# Patient Record
Sex: Female | Born: 1985 | Hispanic: Refuse to answer | Marital: Married | Smoking: Never smoker
Health system: Southern US, Community
[De-identification: ages and names within clinical notes are randomized; demographics above are authoritative.]

## PROBLEM LIST (undated history)

## (undated) DIAGNOSIS — B029 Zoster without complications: Secondary | ICD-10-CM

## (undated) HISTORY — DX: Zoster without complications: B02.9

---
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
## 2007-02-25 HISTORY — PX: WISDOM TOOTH EXTRACTION: SHX21

## 2018-01-25 ENCOUNTER — Encounter: Payer: Self-pay | Admitting: Certified Nurse Midwife

## 2018-01-26 ENCOUNTER — Ambulatory Visit (INDEPENDENT_AMBULATORY_CARE_PROVIDER_SITE_OTHER): Payer: BLUE CROSS/BLUE SHIELD | Admitting: Certified Nurse Midwife

## 2018-01-26 ENCOUNTER — Encounter: Payer: Self-pay | Admitting: Certified Nurse Midwife

## 2018-01-26 VITALS — BP 112/74 | HR 92 | Ht 68.0 in | Wt 204.9 lb

## 2018-01-26 DIAGNOSIS — N912 Amenorrhea, unspecified: Secondary | ICD-10-CM | POA: Diagnosis not present

## 2018-01-26 LAB — POCT URINE PREGNANCY: Preg Test, Ur: POSITIVE — AB

## 2018-01-26 NOTE — Progress Notes (Signed)
Patient here for NOB confirmation.  Patient c/o getting car sick on and off and chest is tender to touch.

## 2018-01-26 NOTE — Progress Notes (Signed)
GYN ENCOUNTER NOTE  Subjective:       Katrina Tate is a 32 y.o. G1P0 female here for pregnancy confirmation. Accompanied by spouse.   Reports two (2) positive home pregnancy test. Endorses breast tenderness and motion sickness.   Denies difficulty breathing or respiratory distress, chest pain, abdominal pain, vaginal bleeding, dysuria, and leg pain or swelling.    Gynecologic History  Patient's last menstrual period was 12/07/2017 (exact date).  Contraception: none  Obstetric History  OB History  Gravida Para Term Preterm AB Living  1            SAB TAB Ectopic Multiple Live Births               # Outcome Date GA Lbr Len/2nd Weight Sex Delivery Anes PTL Lv  1 Current             Past Medical History:  Diagnosis Date  . Shingles 1993    Current Outpatient Medications on File Prior to Visit  Medication Sig Dispense Refill  . Prenatal Vit-Fe Fumarate-FA (MULTIVITAMIN-PRENATAL) 27-0.8 MG TABS tablet Take 1 tablet by mouth daily at 12 noon.     No current facility-administered medications on file prior to visit.     No Known Allergies  Social History   Socioeconomic History  . Marital status: Married    Spouse name: Not on file  . Number of children: Not on file  . Years of education: Not on file  . Highest education level: Not on file  Occupational History  . Not on file  Social Needs  . Financial resource strain: Not on file  . Food insecurity:    Worry: Not on file    Inability: Not on file  . Transportation needs:    Medical: Not on file    Non-medical: Not on file  Tobacco Use  . Smoking status: Never Smoker  . Smokeless tobacco: Never Used  Substance and Sexual Activity  . Alcohol use: Not Currently  . Drug use: Never  . Sexual activity: Yes    Birth control/protection: None  Lifestyle  . Physical activity:    Days per week: Not on file    Minutes per session: Not on file  . Stress: Not on file  Relationships  . Social connections:     Talks on phone: Not on file    Gets together: Not on file    Attends religious service: Not on file    Active member of club or organization: Not on file    Attends meetings of clubs or organizations: Not on file    Relationship status: Not on file  . Intimate partner violence:    Fear of current or ex partner: Not on file    Emotionally abused: Not on file    Physically abused: Not on file    Forced sexual activity: Not on file  Other Topics Concern  . Not on file  Social History Narrative  . Not on file    Family History  Problem Relation Age of Onset  . Breast cancer Neg Hx   . Ovarian cancer Neg Hx   . Colon cancer Neg Hx     The following portions of the patient's history were reviewed and updated as appropriate: allergies, current medications, past family history, past medical history, past social history, past surgical history and problem list.  Review of Systems  ROS negative except as noted above. Information obtained from patient and spouse.   Objective:  BP 112/74   Pulse 92   Ht 5\' 8"  (1.727 m)   Wt 204 lb 14.4 oz (92.9 kg)   LMP 12/07/2017 (Exact Date)   BMI 31.15 kg/m    GENERAL: Alert and oriented x 4, no apparent distress.   PHYSICAL EXAM: Not indicated.   Recent Results (from the past 2160 hour(s))  POCT urine pregnancy     Status: Abnormal   Collection Time: 01/26/18  3:49 PM  Result Value Ref Range   Preg Test, Ur Positive (A) Negative    Assessment:   1. Amenorrhea  - POCT urine pregnancy  Plan:   First trimester education, see AVS  Practice policies and midwifery vs MD care discussed, handouts given  Reviewed red flag symptoms and when to call  RTC x 1-2 weeks for dating/viability US and Nurse Intake or sooner if needed   Gunnar BullaJenkins Michelle , CNM [facility] 01/26/18 5:03 PM    .A total of 20 minutes were spent face-to-face with the patient during the encounter with greater than 50% dealing with  counseling and coordination of care.

## 2018-01-26 NOTE — Patient Instructions (Signed)
Round Ligament Pain The round ligament is a cord of muscle and tissue that helps to support the uterus. It can become a source of pain during pregnancy if it becomes stretched or twisted as the baby grows. The pain usually begins in the second trimester of pregnancy, and it can come and go until the baby is delivered. It is not a serious problem, and it does not cause harm to the baby. Round ligament pain is usually a short, sharp, and pinching pain, but it can also be a dull, lingering, and aching pain. The pain is felt in the lower side of the abdomen or in the groin. It usually starts deep in the groin and moves up to the outside of the hip area. Pain can occur with:  A sudden change in position.  Rolling over in bed.  Coughing or sneezing.  Physical activity.  Follow these instructions at home: Watch your condition for any changes. Take these steps to help with your pain:  When the pain starts, relax. Then try: ? Sitting down. ? Flexing your knees up to your abdomen. ? Lying on your side with one pillow under your abdomen and another pillow between your legs. ? Sitting in a warm bath for 15-20 minutes or until the pain goes away.  Take over-the-counter and prescription medicines only as told by your health care provider.  Move slowly when you sit and stand.  Avoid long walks if they cause pain.  Stop or lessen your physical activities if they cause pain.  Contact a health care provider if:  Your pain does not go away with treatment.  You feel pain in your back that you did not have before.  Your medicine is not helping. Get help right away if:  You develop a fever or chills.  You develop uterine contractions.  You develop vaginal bleeding.  You develop nausea or vomiting.  You develop diarrhea.  You have pain when you urinate. This information is not intended to replace advice given to you by your health care provider. Make sure you discuss any questions you have  with your health care provider. Document Released: 11/20/2007 Document Revised: 07/19/2015 Document Reviewed: 04/19/2014 Elsevier Interactive Patient Education  2018 [employer].  Heartburn During Pregnancy Heartburn is pain or discomfort in the throat or chest. It may cause a burning feeling. It happens when stomach acid moves up into the tube that carries food from your mouth to your stomach (esophagus). Heartburn is common during pregnancy. It usually goes away or gets better after giving birth. Follow these instructions at home: Eating and drinking  Do not drink alcohol while you are pregnant.  Figure out which foods and beverages make you feel worse, and avoid them.  Beverages that you may want to avoid include: ? Coffee and tea (with or without caffeine). ? Energy drinks and sports drinks. ? Bubbly (carbonated) drinks or sodas. ? Citrus fruit juices.  Foods that you may want to avoid include: ? Chocolate and cocoa. ? Peppermint and mint flavorings. ? Garlic, onions, and horseradish. ? Spicy and acidic foods. These include peppers, chili powder, curry powder, vinegar, hot sauces, and barbecue sauce. ? Citrus fruits, such as oranges, lemons, and limes. ? Tomato-based foods, such as red sauce, chili, and salsa. ? Fried and fatty foods, such as donuts, french fries, potato chips, and high-fat dressings. ? High-fat meats, such as hot dogs, cold cuts, sausage, ham, and bacon. ? High-fat dairy items, such as whole milk, butter, and  cheese.  Eat small meals often, instead of large meals.  Avoid drinking a lot of liquid with your meals.  Avoid eating meals during the 2-3 hours before you go to bed.  Avoid lying down right after you eat.  Do not exercise right after you eat. Medicines  Take over-the-counter and prescription medicines only as told by your doctor.  Do not take aspirin, ibuprofen, or other NSAIDs unless your doctor tells you to do that.  Your doctor may tell  you to avoid medicines that have sodium bicarbonate in them. General instructions  If told, raise the head of your bed about 6 inches (15 cm). You can do this by putting blocks under the legs. Sleeping with more pillows does not help with heartburn.  Do not use any products that contain nicotine or tobacco, such as cigarettes and e-cigarettes. If you need help quitting, ask your doctor.  Wear loose-fitting clothing.  Try to lower your stress, such as with yoga or meditation. If you need help, ask your doctor.  Stay at a healthy weight. If you are overweight, work with your doctor to safely lose weight.  Keep all follow-up visits as told by your doctor. This is important. Contact a doctor if:  You get new symptoms.  Your symptoms do not get better with treatment.  You have weight loss and you do not know why.  You have trouble swallowing.  You make loud sounds when you breathe (wheeze).  You have a cough that does not go away.  You have heartburn often for more than 2 weeks.  You feel sick to your stomach (nauseous), and this does not get better with treatment.  You are throwing up (vomiting), and this does not get better with treatment.  You have pain in your belly (abdomen). Get help right away if:  You have very bad chest pain that spreads to your arm, neck, or jaw.  You feel sweaty, dizzy, or light-headed.  You have trouble breathing.  You have pain when swallowing.  You throw up and your throw-up looks like blood or coffee grounds.  Your poop (stool) is bloody or black. This information is not intended to replace advice given to you by your health care provider. Make sure you discuss any questions you have with your health care provider. Document Released: 03/15/2010 Document Revised: 10/29/2015 Document Reviewed: 10/29/2015 Elsevier Interactive Patient Education  2017 [place].  Back Pain in Pregnancy Back pain during pregnancy is common. Back pain may  be caused by several factors that are related to changes during your pregnancy. Follow these instructions at home: Managing pain, stiffness, and swelling  If directed, apply ice for sudden (acute) back pain. ? Put ice in a plastic bag. ? Place a towel between your skin and the bag. ? Leave the ice on for 20 minutes, 2-3 times per day.  If directed, apply heat to the affected area before you exercise: ? Place a towel between your skin and the heat pack or heating pad. ? Leave the heat on for 20-30 minutes. ? Remove the heat if your skin turns bright red. This is especially important if you are unable to feel pain, heat, or cold. You may have a greater risk of getting burned. Activity  Exercise as told by your health care provider. Exercising is the best way to prevent or manage back pain.  Listen to your body when lifting. If lifting hurts, ask for help or bend your knees. This uses your leg  muscles instead of your back muscles.  Squat down when picking up something from the floor. Do not bend over.  Only use bed rest as told by your health care provider. Bed rest should only be used for the most severe episodes of back pain. Standing, Sitting, and Lying Down  Do not stand in one place for long periods of time.  Use good posture when sitting. Make sure your head rests over your shoulders and is not hanging forward. Use a pillow on your lower back if necessary.  Try sleeping on your side, preferably the left side, with a pillow or two between your legs. If you are sore after a night's rest, your bed may be too soft. A firm mattress may provide more support for your back during pregnancy. General instructions  Do not wear high heels.  Eat a healthy diet. Try to gain weight within your health care provider's recommendations.  Use a maternity girdle, elastic sling, or back brace as told by your health care provider.  Take over-the-counter and prescription medicines only as told by  your health care provider.  Keep all follow-up visits as told by your health care provider. This is important. This includes any visits with any specialists, such as a physical therapist. Contact a health care provider if:  Your back pain interferes with your daily activities.  You have increasing pain in other parts of your body. Get help right away if:  You develop numbness, tingling, weakness, or problems with the use of your arms or legs.  You develop severe back pain that is not controlled with medicine.  You have a sudden change in bowel or bladder control.  You develop shortness of breath, dizziness, or you faint.  You develop nausea, vomiting, or sweating.  You have back pain that is a rhythmic, cramping pain similar to labor pains. Labor pain is usually 1-2 minutes apart, lasts for about 1 minute, and involves a bearing down feeling or pressure in your pelvis.  You have back pain and your water breaks or you have vaginal bleeding.  You have back pain or numbness that travels down your leg.  Your back pain developed after you fell.  You develop pain on one side of your back.  You see blood in your urine.  You develop skin blisters in the area of your back pain. This information is not intended to replace advice given to you by your health care provider. Make sure you discuss any questions you have with your health care provider. Document Released: 05/21/2005 Document Revised: 07/19/2015 Document Reviewed: 10/25/2014 Elsevier Interactive Patient Education  2018 [employer].  Morning Sickness Morning sickness is when you feel sick to your stomach (nauseous) during pregnancy. You may feel sick to your stomach and throw up (vomit). You may feel sick in the morning, but you can feel this way any time of day. Some women feel very sick to their stomach and cannot stop throwing up (hyperemesis gravidarum). Follow these instructions at home:  Only take medicines as told by  your doctor.  Take multivitamins as told by your doctor. Taking multivitamins before getting pregnant can stop or lessen the harshness of morning sickness.  Eat dry toast or unsalted crackers before getting out of bed.  Eat 5 to 6 small meals a day.  Eat dry and bland foods like rice and baked potatoes.  Do not drink liquids with meals. Drink between meals.  Do not eat greasy, fatty, or spicy foods.  Have  someone cook for you if the smell of food causes you to feel sick or throw up.  If you feel sick to your stomach after taking prenatal vitamins, take them at night or with a snack.  Eat protein when you need a snack (nuts, yogurt, cheese).  Eat unsweetened gelatins for dessert.  Wear a bracelet used for sea sickness (acupressure wristband).  Go to a doctor that puts thin needles into certain body points (acupuncture) to improve how you feel.  Do not smoke.  Use a humidifier to keep the air in your house free of odors.  Get lots of fresh air. Contact a doctor if:  You need medicine to feel better.  You feel dizzy or lightheaded.  You are losing weight. Get help right away if:  You feel very sick to your stomach and cannot stop throwing up.  You pass out (faint). This information is not intended to replace advice given to you by your health care provider. Make sure you discuss any questions you have with your health care provider. Document Released: 03/20/2004 Document Revised: 07/19/2015 Document Reviewed: 07/28/2012 Elsevier Interactive Patient Education  2017 [employer] WHAT OB PATIENTS CAN EXPECT   Confirmation of pregnancy and ultrasound ordered if medically indicated-[redacted] weeks gestation  New OB (NOB) intake with nurse and New OB (NOB) labs- [redacted] weeks gestation  New OB (NOB) physical examination with provider- 11/[redacted] weeks gestation  Flu vaccine-[redacted] weeks gestation  Anatomy scan-[redacted] weeks gestation  Glucose tolerance test, blood work to test for anemia,  T-dap vaccine-[redacted] weeks gestation  Vaginal swabs/cultures-STD/Group B strep-[redacted] weeks gestation  Appointments every 4 weeks until 28 weeks  Every 2 weeks from 28 weeks until 36 weeks  Weekly visits from 36 weeks until delivery   Eating Plan for Pregnant Women While you are pregnant, your body will require additional nutrition to help support your growing baby. It is recommended that you consume:  150 additional calories each day during your first trimester.  300 additional calories each day during your second trimester.  300 additional calories each day during your third trimester.  Eating a healthy, well-balanced diet is very important for your health and for your baby's health. You also have a higher need for some vitamins and minerals, such as folic acid, calcium, iron, and vitamin D. What do I need to know about eating during pregnancy?  Do not try to lose weight or go on a diet during pregnancy.  Choose healthy, nutritious foods. Choose  of a sandwich with a glass of milk instead of a candy bar or a high-calorie sugar-sweetened beverage.  Limit your overall intake of foods that have "empty calories." These are foods that have little nutritional value, such as sweets, desserts, candies, sugar-sweetened beverages, and fried foods.  Eat a variety of foods, especially fruits and vegetables.  Take a prenatal vitamin to help meet the additional needs during pregnancy, specifically for folic acid, iron, calcium, and vitamin D.  Remember to stay active. Ask your health care provider for exercise recommendations that are specific to you.  Practice good food safety and cleanliness, such as washing your hands before you eat and after you prepare raw meat. This helps to prevent foodborne illnesses, such as listeriosis, that can be very dangerous for your baby. Ask your health care provider for more information about listeriosis. What does 150 extra calories look like? Healthy options for  an additional 150 calories each day could be any of the following:  Plain low-fat yogurt (6-8  oz) with  cup of berries.  1 apple with 2 teaspoons of peanut butter.  Cut-up vegetables with  cup of hummus.  Low-fat chocolate milk (8 oz or 1 cup).  1 string cheese with 1 medium orange.   of a peanut butter and jelly sandwich on whole-wheat bread (1 tsp of peanut butter).  For 300 calories, you could eat two of those healthy options each day. What is a healthy amount of weight to gain? The recommended amount of weight for you to gain is based on your pre-pregnancy BMI. If your pre-pregnancy BMI was:  Less than 18 (underweight), you should gain 28-40 lb.  18-24.9 (normal), you should gain 25-35 lb.  25-29.9 (overweight), you should gain 15-25 lb.  Greater than 30 (obese), you should gain 11-20 lb.  What if I am having twins or multiples? Generally, pregnant women who will be having twins or multiples may need to increase their daily calories by 300-600 calories each day. The recommended range for total weight gain is 25-54 lb, depending on your pre-pregnancy BMI. Talk with your health care provider for specific guidance about additional nutritional needs, weight gain, and exercise during your pregnancy. What foods can I eat? Grains Any grains. Try to choose whole grains, such as whole-wheat bread, oatmeal, or brown rice. Vegetables Any vegetables. Try to eat a variety of colors and types of vegetables to get a full range of vitamins and minerals. Remember to wash your vegetables well before eating. Fruits Any fruits. Try to eat a variety of colors and types of fruit to get a full range of vitamins and minerals. Remember to wash your fruits well before eating. Meats and Other Protein Sources Lean meats, including chicken, [place], fish, and lean cuts of beef, veal, or pork. Make sure that all meats are cooked to "well done." Tofu. Tempeh. Beans. Eggs. Peanut butter and other nut  butters. Seafood, such as shrimp, crab, and lobster. If you choose fish, select types that are higher in omega-3 fatty acids, including salmon, herring, mussels, trout, sardines, and pollock. Make sure that all meats are cooked to food-safe temperatures. Dairy Pasteurized milk and milk alternatives. Pasteurized yogurt and pasteurized cheese. Cottage cheese. Sour cream. Beverages Water. Juices that contain 100% fruit juice or vegetable juice. Caffeine-free teas and decaffeinated coffee. Drinks that contain caffeine are okay to drink, but it is better to avoid caffeine. Keep your total caffeine intake to less than 200 mg each day (12 oz of coffee, tea, or soda) or as directed by your health care provider. Condiments Any pasteurized condiments. Sweets and Desserts Any sweets and desserts. Fats and Oils Any fats and oils. The items listed above may not be a complete list of recommended foods or beverages. Contact your dietitian for more options. What foods are not recommended? Vegetables Unpasteurized (raw) vegetable juices. Fruits Unpasteurized (raw) fruit juices. Meats and Other Protein Sources Cured meats that have nitrates, such as bacon, salami, and hotdogs. Luncheon meats, bologna, or other deli meats (unless they are reheated until they are steaming hot). Refrigerated pate, meat spreads from a meat counter, smoked seafood that is found in the refrigerated section of a store. Raw fish, such as sushi or sashimi. High mercury content fish, such as tilefish, shark, swordfish, and king mackerel. Raw meats, such as tuna or beef tartare. Undercooked meats and poultry. Make sure that all meats are cooked to food-safe temperatures. Dairy Unpasteurized (raw) milk and any foods that have raw milk in them. Soft cheeses, such as  feta, queso blanco, queso fresco, Brie, Camembert cheeses, blue-veined cheeses, and Panela cheese (unless it is made with pasteurized milk, which must be stated on the  label). Beverages Alcohol. Sugar-sweetened beverages, such as sodas, teas, or energy drinks. Condiments Homemade fermented foods and drinks, such as pickles, sauerkraut, or kombucha drinks. (Store-bought pasteurized versions of these are okay.) Other Salads that are made in the store, such as ham salad, chicken salad, egg salad, tuna salad, and seafood salad. The items listed above may not be a complete list of foods and beverages to avoid. Contact your dietitian for more information. This information is not intended to replace advice given to you by your health care provider. Make sure you discuss any questions you have with your health care provider. Document Released: 11/25/2013 Document Revised: 07/19/2015 Document Reviewed: 07/26/2013 Elsevier Interactive Patient Education  2018 [employer].  Common Medications Safe in Pregnancy  Acne:      Constipation:  Benzoyl Peroxide     Colace  Clindamycin      Dulcolax Suppository  Topica Erythromycin     Fibercon  Salicylic Acid      Metamucil         Miralax AVOID:        Senakot   Accutane    Cough:  Retin-A       Cough Drops  Tetracycline      Phenergan w/ Codeine if Rx  Minocycline      Robitussin (Plain & DM)  Antibiotics:     Crabs/Lice:  Ceclor       RID  Cephalosporins    AVOID:  E-Mycins      Kwell  Keflex  Macrobid/Macrodantin   Diarrhea:  Penicillin      Kao-Pectate  Zithromax      Imodium AD         PUSH FLUIDS AVOID:       Cipro     Fever:  Tetracycline      Tylenol (Regular or Extra  Minocycline       Strength)  Levaquin      Extra Strength-Do not          Exceed 8 tabs/24 hrs Caffeine:        <221m/day (equiv. To 1 cup of coffee or  approx. 3 12 oz sodas)         Gas: Cold/Hayfever:       Gas-X  Benadryl      Mylicon  Claritin       Phazyme  **Claritin-D        Chlor-Trimeton    Headaches:  Dimetapp      ASA-Free Excedrin  Drixoral-Non-Drowsy     Cold Compress  Mucinex (Guaifenasin)     Tylenol  (Regular or Extra  Sudafed/Sudafed-12 Hour     Strength)  **Sudafed PE Pseudoephedrine   Tylenol Cold & Sinus     Vicks Vapor Rub  Zyrtec  **AVOID if Problems With Blood Pressure         Heartburn: Avoid lying down for at least 1 hour after meals  Aciphex      Maalox     Rash:  Milk of Magnesia     Benadryl    Mylanta       1% Hydrocortisone Cream  Pepcid  Pepcid Complete   Sleep Aids:  Prevacid      Ambien   Prilosec       Benadryl  Rolaids       Chamomile Tea  Tums (  Limit 4/day)     Unisom  Zantac       Tylenol PM         Warm milk-add vanilla or  Hemorrhoids:       Sugar for taste  Anusol/Anusol H.C.  (RX: Analapram 2.5%)  Sugar Substitutes:  Hydrocortisone OTC     Ok in moderation  Preparation H      Tucks        Vaseline lotion applied to tissue with wiping    Herpes:     Throat:  Acyclovir      Oragel  Famvir  Valtrex     Vaccines:         Flu Shot Leg Cramps:       *Gardasil  Benadryl      Hepatitis A         Hepatitis B Nasal Spray:       Pneumovax  Saline Nasal Spray     Polio Booster         Tetanus Nausea:       Tuberculosis test or PPD  Vitamin B6 25 mg TID   AVOID:    Dramamine      *Gardasil  Emetrol       Live Poliovirus  Ginger Root 250 mg QID    MMR (measles, mumps &  High Complex Carbs @ Bedtime    rebella)  Sea Bands-Accupressure    Varicella (Chickenpox)  Unisom 1/2 tab TID     *No known complications           If received before Pain:         Known pregnancy;   Darvocet       Resume series after  Lortab        Delivery  Percocet    Yeast:   Tramadol      Femstat  Tylenol 3      Gyne-lotrimin  Ultram       Monistat  Vicodin           MISC:         All Sunscreens           Hair Coloring/highlights          Insect Repellant's          (Including DEET)         Mystic Tans First Trimester of Pregnancy The first trimester of pregnancy is from week 1 until the end of week 13 (months 1 through 3). During this time, your baby will  begin to develop inside you. At 6-8 weeks, the eyes and face are formed, and the heartbeat can be seen on ultrasound. At the end of 12 weeks, all the baby's organs are formed. Prenatal care is all the medical care you receive before the birth of your baby. Make sure you get good prenatal care and follow all of your doctor's instructions. Follow these instructions at home: Medicines  Take over-the-counter and prescription medicines only as told by your doctor. Some medicines are safe and some medicines are not safe during pregnancy.  Take a prenatal vitamin that contains at least 600 micrograms (mcg) of folic acid.  If you have trouble pooping (constipation), take medicine that will make your stool soft (stool softener) if your doctor approves. Eating and drinking  Eat regular, healthy meals.  Your doctor will tell you the amount of weight gain that is right for you.  Avoid raw meat and uncooked cheese.  If you feel sick to  your stomach (nauseous) or throw up (vomit): ? Eat 4 or 5 small meals a day instead of 3 large meals. ? Try eating a few soda crackers. ? Drink liquids between meals instead of during meals.  To prevent constipation: ? Eat foods that are high in fiber, like fresh fruits and vegetables, whole grains, and beans. ? Drink enough fluids to keep your pee (urine) clear or pale yellow. Activity  Exercise only as told by your doctor. Stop exercising if you have cramps or pain in your lower belly (abdomen) or low back.  Do not exercise if it is too hot, too humid, or if you are in a place of great height (high altitude).  Try to avoid standing for long periods of time. Move your legs often if you must stand in one place for a long time.  Avoid heavy lifting.  Wear low-heeled shoes. Sit and stand up straight.  You can have sex unless your doctor tells you not to. Relieving pain and discomfort  Wear a good support bra if your breasts are sore.  Take warm water baths  (sitz baths) to soothe pain or discomfort caused by hemorrhoids. Use hemorrhoid cream if your doctor says it is okay.  Rest with your legs raised if you have leg cramps or low back pain.  If you have puffy, bulging veins (varicose veins) in your legs: ? Wear support hose or compression stockings as told by your doctor. ? Raise (elevate) your feet for 15 minutes, 3-4 times a day. ? Limit salt in your food. Prenatal care  Schedule your prenatal visits by the twelfth week of pregnancy.  Write down your questions. Take them to your prenatal visits.  Keep all your prenatal visits as told by your doctor. This is important. Safety  Wear your seat belt at all times when driving.  Make a list of emergency phone numbers. The list should include numbers for family, friends, the hospital, and police and fire departments. General instructions  Ask your doctor for a referral to a local prenatal class. Begin classes no later than at the start of month 6 of your pregnancy.  Ask for help if you need counseling or if you need help with nutrition. Your doctor can give you advice or tell you where to go for help.  Do not use hot tubs, steam rooms, or saunas.  Do not douche or use tampons or scented sanitary pads.  Do not cross your legs for long periods of time.  Avoid all herbs and alcohol. Avoid drugs that are not approved by your doctor.  Do not use any tobacco products, including cigarettes, chewing tobacco, and electronic cigarettes. If you need help quitting, ask your doctor. You may get counseling or other support to help you quit.  Avoid cat litter boxes and soil used by cats. These carry germs that can cause birth defects in the baby and can cause a loss of your baby (miscarriage) or stillbirth.  Visit your dentist. At home, brush your teeth with a soft toothbrush. Be gentle when you floss. Contact a doctor if:  You are dizzy.  You have mild cramps or pressure in your lower  belly.  You have a nagging pain in your belly area.  You continue to feel sick to your stomach, you throw up, or you have watery poop (diarrhea).  You have a bad smelling fluid coming from your vagina.  You have pain when you pee (urinate).  You have increased puffiness (swelling) in  your face, hands, legs, or ankles. Get help right away if:  You have a fever.  You are leaking fluid from your vagina.  You have spotting or bleeding from your vagina.  You have very bad belly cramping or pain.  You gain or lose weight rapidly.  You throw up blood. It may look like coffee grounds.  You are around people who have [place] measles, fifth disease, or chickenpox.  You have a very bad headache.  You have shortness of breath.  You have any kind of trauma, such as from a fall or a car accident. Summary  The first trimester of pregnancy is from week 1 until the end of week 13 (months 1 through 3).  To take care of yourself and your unborn baby, you will need to eat healthy meals, take medicines only if your doctor tells you to do so, and do activities that are safe for you and your baby.  Keep all follow-up visits as told by your doctor. This is important as your doctor will have to ensure that your baby is healthy and growing well. This information is not intended to replace advice given to you by your health care provider. Make sure you discuss any questions you have with your health care provider. Document Released: 07/30/2007 Document Revised: 02/19/2016 Document Reviewed: 02/19/2016 Elsevier Interactive Patient Education  2017 [employer].

## 2018-02-02 ENCOUNTER — Other Ambulatory Visit: Payer: Self-pay | Admitting: Obstetrics and Gynecology

## 2018-02-02 ENCOUNTER — Ambulatory Visit (INDEPENDENT_AMBULATORY_CARE_PROVIDER_SITE_OTHER): Payer: BLUE CROSS/BLUE SHIELD | Admitting: Obstetrics and Gynecology

## 2018-02-02 ENCOUNTER — Ambulatory Visit (INDEPENDENT_AMBULATORY_CARE_PROVIDER_SITE_OTHER): Payer: BLUE CROSS/BLUE SHIELD

## 2018-02-02 DIAGNOSIS — Z3401 Encounter for supervision of normal first pregnancy, first trimester: Secondary | ICD-10-CM

## 2018-02-02 DIAGNOSIS — Z3491 Encounter for supervision of normal pregnancy, unspecified, first trimester: Secondary | ICD-10-CM

## 2018-02-02 NOTE — Progress Notes (Signed)
[facility]. Pregnancy confirmation done 01/26/18 [facility].  G-1 .  P-0    . Pregnancy education material explained and given. _0_ cats in the home. NOB labs ordered. (TSH/HbgA1c due to Increased BMI), HIV labs and Drug screen were explained optional and she did not decline. Drug screen ordered. PNV encouraged. Genetic screening options discussed. Genetic testing: Ordered.  Pt may discuss with provider. Pt. To follow up with provider in _3_ weeks for NOB physical.  All questions answered.Return to office for genetic testing on 02/22/18.

## 2018-02-03 LAB — URINALYSIS, ROUTINE W REFLEX MICROSCOPIC
Bilirubin, UA: NEGATIVE
Glucose, UA: NEGATIVE
Ketones, UA: NEGATIVE
NITRITE UA: NEGATIVE
Protein, UA: NEGATIVE
RBC, UA: NEGATIVE
Specific Gravity, UA: 1.015 (ref 1.005–1.030)
Urobilinogen, Ur: 0.2 mg/dL (ref 0.2–1.0)
pH, UA: 7 (ref 5.0–7.5)

## 2018-02-03 LAB — MONITOR DRUG PROFILE 14(MW)
Amphetamine Scrn, Ur: NEGATIVE ng/mL
BARBITURATE SCREEN URINE: NEGATIVE ng/mL
BENZODIAZEPINE SCREEN, URINE: NEGATIVE ng/mL
Buprenorphine, Urine: NEGATIVE ng/mL
CANNABINOIDS UR QL SCN: NEGATIVE ng/mL
Cocaine (Metab) Scrn, Ur: NEGATIVE ng/mL
Creatinine(Crt), U: 85.6 mg/dL (ref 20.0–300.0)
Fentanyl, Urine: NEGATIVE pg/mL
Meperidine Screen, Urine: NEGATIVE ng/mL
Methadone Screen, Urine: NEGATIVE ng/mL
OXYCODONE+OXYMORPHONE UR QL SCN: NEGATIVE ng/mL
Opiate Scrn, Ur: NEGATIVE ng/mL
Ph of Urine: 7.1 (ref 4.5–8.9)
Phencyclidine Qn, Ur: NEGATIVE ng/mL
Propoxyphene Scrn, Ur: NEGATIVE ng/mL
SPECIFIC GRAVITY: 1.014
TRAMADOL SCREEN, URINE: NEGATIVE ng/mL

## 2018-02-03 LAB — TSH: TSH: 0.74 u[IU]/mL (ref 0.450–4.500)

## 2018-02-03 LAB — MICROSCOPIC EXAMINATION: Casts: NONE SEEN /lpf

## 2018-02-03 LAB — HEMOGLOBIN A1C
Est. average glucose Bld gHb Est-mCnc: 100 mg/dL
Hgb A1c MFr Bld: 5.1 % (ref 4.8–5.6)

## 2018-02-03 LAB — ABO AND RH: Rh Factor: POSITIVE

## 2018-02-03 LAB — ANTIBODY SCREEN: Antibody Screen: NEGATIVE

## 2018-02-03 LAB — NICOTINE SCREEN, URINE: Cotinine Ql Scrn, Ur: NEGATIVE ng/mL

## 2018-02-03 LAB — HIV ANTIBODY (ROUTINE TESTING W REFLEX): HIV Screen 4th Generation wRfx: NONREACTIVE

## 2018-02-03 LAB — VARICELLA ZOSTER ANTIBODY, IGG: Varicella zoster IgG: 621 index (ref >165)

## 2018-02-03 LAB — RUBELLA SCREEN: Rubella Antibodies, IGG: <0.9 index — ABNORMAL LOW (ref >0.99)

## 2018-02-03 LAB — HEPATITIS B SURFACE ANTIGEN: Hepatitis B Surface Ag: NEGATIVE

## 2018-02-03 LAB — RPR: RPR Ser Ql: NONREACTIVE

## 2018-02-04 LAB — URINE CULTURE: Organism ID, Bacteria: NO GROWTH

## 2018-02-04 LAB — GC/CHLAMYDIA PROBE AMP
Chlamydia trachomatis, NAA: NEGATIVE
Neisseria gonorrhoeae by PCR: NEGATIVE

## 2018-02-22 ENCOUNTER — Encounter: Payer: Self-pay | Admitting: Obstetrics and Gynecology

## 2018-02-22 ENCOUNTER — Other Ambulatory Visit: Payer: BLUE CROSS/BLUE SHIELD

## 2018-02-22 ENCOUNTER — Ambulatory Visit (INDEPENDENT_AMBULATORY_CARE_PROVIDER_SITE_OTHER): Payer: BLUE CROSS/BLUE SHIELD | Admitting: Obstetrics and Gynecology

## 2018-02-22 ENCOUNTER — Other Ambulatory Visit (HOSPITAL_COMMUNITY)
Admission: RE | Admit: 2018-02-22 | Discharge: 2018-02-22 | Disposition: A | Discharge status: Home / Self Care | Payer: BLUE CROSS/BLUE SHIELD | Source: Ambulatory Visit | Attending: Obstetrics and Gynecology | Admitting: Obstetrics and Gynecology

## 2018-02-22 VITALS — BP 105/72 | HR 92 | Ht 68.0 in | Wt 206.1 lb

## 2018-02-22 DIAGNOSIS — R0989 Other specified symptoms and signs involving the circulatory and respiratory systems: Secondary | ICD-10-CM

## 2018-02-22 DIAGNOSIS — O9989 Other specified diseases and conditions complicating pregnancy, childbirth and the puerperium: Secondary | ICD-10-CM

## 2018-02-22 DIAGNOSIS — Z3401 Encounter for supervision of normal first pregnancy, first trimester: Secondary | ICD-10-CM | POA: Insufficient documentation

## 2018-02-22 DIAGNOSIS — Z3A10 10 weeks gestation of pregnancy: Secondary | ICD-10-CM

## 2018-02-22 DIAGNOSIS — Z2839 Other underimmunization status: Secondary | ICD-10-CM | POA: Insufficient documentation

## 2018-02-22 DIAGNOSIS — Z124 Encounter for screening for malignant neoplasm of cervix: Secondary | ICD-10-CM

## 2018-02-22 DIAGNOSIS — O99213 Obesity complicating pregnancy, third trimester: Secondary | ICD-10-CM

## 2018-02-22 DIAGNOSIS — O09899 Supervision of other high risk pregnancies, unspecified trimester: Secondary | ICD-10-CM

## 2018-02-22 DIAGNOSIS — E669 Obesity, unspecified: Secondary | ICD-10-CM | POA: Insufficient documentation

## 2018-02-22 DIAGNOSIS — Z283 Underimmunization status: Secondary | ICD-10-CM

## 2018-02-22 LAB — POCT URINALYSIS DIPSTICK OB
Bilirubin, UA: NEGATIVE
Blood, UA: NEGATIVE
Glucose, UA: NEGATIVE
Ketones, UA: NEGATIVE
Leukocytes, UA: NEGATIVE
Nitrite, UA: NEGATIVE
POC,PROTEIN,UA: NEGATIVE
Spec Grav, UA: 1.01 (ref 1.010–1.025)
Urobilinogen, UA: 0.2 U/dL
pH, UA: 6 (ref 5.0–8.0)

## 2018-02-22 NOTE — Patient Instructions (Addendum)
Common Medications Safe in Pregnancy  Acne:      Constipation:  Benzoyl Peroxide     Colace  Clindamycin      Dulcolax Suppository  Topica Erythromycin     Fibercon  Salicylic Acid      Metamucil         Miralax AVOID:        Senakot   Accutane    Cough:  Retin-A       Cough Drops  Tetracycline      Phenergan w/ Codeine if Rx  Minocycline      Robitussin (Plain & DM)  Antibiotics:     Crabs/Lice:  Ceclor       RID  Cephalosporins    AVOID:  E-Mycins      Kwell  Keflex  Macrobid/Macrodantin   Diarrhea:  Penicillin      Kao-Pectate  Zithromax      Imodium AD         PUSH FLUIDS AVOID:       Cipro     Fever:  Tetracycline      Tylenol (Regular or Extra  Minocycline       Strength)  Levaquin      Extra Strength-Do not          Exceed 8 tabs/24 hrs Caffeine:        <200mg/day (equiv. To 1 cup of coffee or  approx. 3 12 oz sodas)         Gas: Cold/Hayfever:       Gas-X  Benadryl      Mylicon  Claritin       Phazyme  **Claritin-D        Chlor-Trimeton    Headaches:  Dimetapp      ASA-Free Excedrin  Drixoral-Non-Drowsy     Cold Compress  Mucinex (Guaifenasin)     Tylenol (Regular or Extra  Sudafed/Sudafed-12 Hour     Strength)  **Sudafed PE Pseudoephedrine   Tylenol Cold & Sinus     Vicks Vapor Rub  Zyrtec  **AVOID if Problems With Blood Pressure         Heartburn: Avoid lying down for at least 1 hour after meals  Aciphex      Maalox     Rash:  Milk of Magnesia     Benadryl    Mylanta       1% Hydrocortisone Cream  Pepcid  Pepcid Complete   Sleep Aids:  Prevacid      Ambien   Prilosec       Benadryl  Rolaids       Chamomile Tea  Tums (Limit 4/day)     Unisom  Zantac       Tylenol PM         Warm milk-add vanilla or  Hemorrhoids:       Sugar for taste  Anusol/Anusol H.C.  (RX: Analapram 2.5%)  Sugar Substitutes:  Hydrocortisone OTC     Ok in moderation  Preparation H      Tucks        Vaseline lotion applied to tissue with  wiping    Herpes:     Throat:  Acyclovir      Oragel  Famvir  Valtrex     Vaccines:         Flu Shot Leg Cramps:       *Gardasil  Benadryl      Hepatitis A         Hepatitis B Nasal Spray:         Pneumovax  Saline Nasal Spray     Polio Booster         Tetanus Nausea:       Tuberculosis test or PPD  Vitamin B6 25 mg TID   AVOID:    Dramamine      *Gardasil  Emetrol       Live Poliovirus  Ginger Root 250 mg QID    MMR (measles, mumps &  High Complex Carbs @ Bedtime    rebella)  Sea Bands-Accupressure    Varicella (Chickenpox)  Unisom 1/2 tab TID     *No known complications           If received before Pain:         Known pregnancy;   Darvocet       Resume series after  Lortab        Delivery  Percocet    Yeast:   Tramadol      Femstat  Tylenol 3      Gyne-lotrimin  Ultram       Monistat  Vicodin           MISC:         All Sunscreens           Hair Coloring/highlights          Insect Repellant's          (Including DEET)         Mystic Tans        First Trimester of Pregnancy  The first trimester of pregnancy is from week 1 until the end of week 13 (months 1 through 3). During this time, your baby will begin to develop inside you. At 6-8 weeks, the eyes and face are formed, and the heartbeat can be seen on ultrasound. At the end of 12 weeks, all the baby's organs are formed. Prenatal care is all the medical care you receive before the birth of your baby. Make sure you get good prenatal care and follow all of your doctor's instructions. Follow these instructions at home: Medicines  Take over-the-counter and prescription medicines only as told by your doctor. Some medicines are safe and some medicines are not safe during pregnancy.  Take a prenatal vitamin that contains at least 600 micrograms (mcg) of folic acid.  If you have trouble pooping (constipation), take medicine that will make your stool soft (stool softener) if your doctor approves. Eating and  drinking   Eat regular, healthy meals.  Your doctor will tell you the amount of weight gain that is right for you.  Avoid raw meat and uncooked cheese.  If you feel sick to your stomach (nauseous) or throw up (vomit): ? Eat 4 or 5 small meals a day instead of 3 large meals. ? Try eating a few soda crackers. ? Drink liquids between meals instead of during meals.  To prevent constipation: ? Eat foods that are high in fiber, like fresh fruits and vegetables, whole grains, and beans. ? Drink enough fluids to keep your pee (urine) clear or pale yellow. Activity  Exercise only as told by your doctor. Stop exercising if you have cramps or pain in your lower belly (abdomen) or low back.  Do not exercise if it is too hot, too humid, or if you are in a place of great height (high altitude).  Try to avoid standing for long periods of time. Move your legs often if you must stand in one place for a long time.  Avoid  heavy lifting.  Wear low-heeled shoes. Sit and stand up straight.  You can have sex unless your doctor tells you not to. Relieving pain and discomfort  Wear a good support bra if your breasts are sore.  Take warm water baths (sitz baths) to soothe pain or discomfort caused by hemorrhoids. Use hemorrhoid cream if your doctor says it is okay.  Rest with your legs raised if you have leg cramps or low back pain.  If you have puffy, bulging veins (varicose veins) in your legs: ? Wear support hose or compression stockings as told by your doctor. ? Raise (elevate) your feet for 15 minutes, 3-4 times a day. ? Limit salt in your food. Prenatal care  Schedule your prenatal visits by the twelfth week of pregnancy.  Write down your questions. Take them to your prenatal visits.  Keep all your prenatal visits as told by your doctor. This is important. Safety  Wear your seat belt at all times when driving.  Make a list of emergency phone numbers. The list should include numbers  for family, friends, the hospital, and police and fire departments. General instructions  Ask your doctor for a referral to a local prenatal class. Begin classes no later than at the start of month 6 of your pregnancy.  Ask for help if you need counseling or if you need help with nutrition. Your doctor can give you advice or tell you where to go for help.  Do not use hot tubs, steam rooms, or saunas.  Do not douche or use tampons or scented sanitary pads.  Do not cross your legs for long periods of time.  Avoid all herbs and alcohol. Avoid drugs that are not approved by your doctor.  Do not use any tobacco products, including cigarettes, chewing tobacco, and electronic cigarettes. If you need help quitting, ask your doctor. You may get counseling or other support to help you quit.  Avoid cat litter boxes and soil used by cats. These carry germs that can cause birth defects in the baby and can cause a loss of your baby (miscarriage) or stillbirth.  Visit your dentist. At home, brush your teeth with a soft toothbrush. Be gentle when you floss. Contact a doctor if:  You are dizzy.  You have mild cramps or pressure in your lower belly.  You have a nagging pain in your belly area.  You continue to feel sick to your stomach, you throw up, or you have watery poop (diarrhea).  You have a bad smelling fluid coming from your vagina.  You have pain when you pee (urinate).  You have increased puffiness (swelling) in your face, hands, legs, or ankles. Get help right away if:  You have a fever.  You are leaking fluid from your vagina.  You have spotting or bleeding from your vagina.  You have very bad belly cramping or pain.  You gain or lose weight rapidly.  You throw up blood. It may look like coffee grounds.  You are around people who have [place] measles, fifth disease, or chickenpox.  You have a very bad headache.  You have shortness of breath.  You have any kind of  trauma, such as from a fall or a car accident. Summary  The first trimester of pregnancy is from week 1 until the end of week 13 (months 1 through 3).  To take care of yourself and your unborn baby, you will need to eat healthy meals, take medicines only if your doctor  tells you to do so, and do activities that are safe for you and your baby.  Keep all follow-up visits as told by your doctor. This is important as your doctor will have to ensure that your baby is healthy and growing well. This information is not intended to replace advice given to you by your health care provider. Make sure you discuss any questions you have with your health care provider. Document Released: 07/30/2007 Document Revised: 02/19/2016 Document Reviewed: 02/19/2016 Elsevier Interactive Patient Education  2019 [employer].

## 2018-02-22 NOTE — Progress Notes (Signed)
OBSTETRIC INITIAL PRENATAL VISIT  Subjective:    Katrina Tate is being seen today for her first obstetrical visit.  This is a planned pregnancy. She is a G46P0 female at 78w5dgestation, Estimated Date of Delivery: 09/15/18 with Patient's last menstrual period was 12/07/2017 (exact date),  consistent with 7 week sono. Her obstetrical history is significant for none. Relationship with FOB: spouse, living together. Patient does intend to breast feed. Pregnancy history fully reviewed.    OB History  Gravida Para Term Preterm AB Living  1 0 0 0 0 0  SAB TAB Ectopic Multiple Live Births  0 0 0 0 0    # Outcome Date GA Lbr Len/2nd Weight Sex Delivery Anes PTL Lv  1 Current             Gynecologic History:  Patient has never had a pap smear.  Notes she has not seen a care provider since she was 89  Denies history of STIs.    Past Medical History:  Diagnosis Date  . Shingles 1993    Family History  Problem Relation Age of Onset  . Thyroid disease Maternal Grandmother   . Breast cancer Neg Hx   . Ovarian cancer Neg Hx   . Colon cancer Neg Hx     Past Surgical History:  Procedure Laterality Date  . WISDOM TOOTH EXTRACTION  2009    Social History   Socioeconomic History  . Marital status: Married    Spouse name: Not on file  . Number of children: Not on file  . Years of education: Not on file  . Highest education level: Not on file  Occupational History  . Not on file  Social Needs  . Financial resource strain: Not on file  . Food insecurity:    Worry: Not on file    Inability: Not on file  . Transportation needs:    Medical: Not on file    Non-medical: Not on file  Tobacco Use  . Smoking status: Never Smoker  . Smokeless tobacco: Never Used  Substance and Sexual Activity  . Alcohol use: Not Currently  . Drug use: Never  . Sexual activity: Yes    Birth control/protection: None  Lifestyle  . Physical activity:    Days per week: Not on file   Minutes per session: Not on file  . Stress: Not on file  Relationships  . Social connections:    Talks on phone: Not on file    Gets together: Not on file    Attends religious service: Not on file    Active member of club or organization: Not on file    Attends meetings of clubs or organizations: Not on file    Relationship status: Not on file  . Intimate partner violence:    Fear of current or ex partner: Not on file    Emotionally abused: Not on file    Physically abused: Not on file    Forced sexual activity: Not on file  Other Topics Concern  . Not on file  Social History Narrative  . Not on file     Current Outpatient Medications on File Prior to Visit  Medication Sig Dispense Refill  . Prenatal Vit-Fe Fumarate-FA (MULTIVITAMIN-PRENATAL) 27-0.8 MG TABS tablet Take 1 tablet by mouth daily at 12 noon.     No current facility-administered medications on file prior to visit.      No Known Allergies   Review of Systems General:Not Present- Fever, Weight  Loss and Weight Gain. Skin:Not Present- Rash. HEENT:Not Present- Blurred Vision, Headache and Bleeding Gums. Respiratory:Not Present- Difficulty Breathing. Positive for URI symptoms.  Breast:Not Present- Breast Mass. Cardiovascular:Not Present- Chest Pain, Elevated Blood Pressure, Fainting / Blacking Out and Shortness of Breath. Gastrointestinal:Not Present- Abdominal Pain, Constipation, Nausea and Vomiting. Female Genitourinary:Not Present- Frequency, Painful Urination, Pelvic Pain, Vaginal Bleeding, Vaginal Discharge, Contractions, regular, Fetal Movements Decreased, Urinary Complaints and Vaginal Fluid. Musculoskeletal:Not Present- Back Pain and Leg Cramps. Neurological:Not Present- Dizziness. Psychiatric:Not Present- Depression.     Objective:   Blood pressure 105/72, pulse 92, weight 206 lb 1.6 oz (93.5 kg), last menstrual period 12/07/2017.  Body mass index is 31.34 kg/m.  General Appearance:     Alert, cooperative, no distress, appears stated age, mild obesity  Head:    Normocephalic, without obvious abnormality, atraumatic  Eyes:    PERRL, conjunctiva/corneas clear, EOM's intact, both eyes  Ears:    Normal external ear canals, both ears  Nose:   Nares normal, septum midline, mucosa normal, no drainage or sinus tenderness  Throat:   Lips, mucosa, and tongue normal; teeth and gums normal  Neck:   Supple, symmetrical, trachea midline, no adenopathy; thyroid: no enlargement/tenderness/nodules; no carotid bruit or JVD  Back:     Symmetric, no curvature, ROM normal, no CVA tenderness  Lungs:     Clear to auscultation bilaterally, respirations unlabored  Chest Wall:    No tenderness or deformity   Heart:    Regular rate and rhythm, S1 and S2 normal, no murmur, rub or gallop  Breast Exam:    No tenderness, masses, or nipple abnormality  Abdomen:     Soft, non-tender, bowel sounds active all four quadrants, no masses, no organomegaly.  FH 11.  FHT 173  bpm.  Genitalia:    Pelvic:external genitalia normal, vagina without lesions, discharge, or tenderness, rectovaginal septum  normal. Cervix normal in appearance, no cervical motion tenderness, no adnexal masses or tenderness.  Pregnancy positive findings: uterine enlargement: 11 wk size, nontender.   Rectal:    Normal external sphincter.  No hemorrhoids appreciated. Internal exam not done.   Extremities:   Extremities normal, atraumatic, no cyanosis or edema  Pulses:   2+ and symmetric all extremities  Skin:   Skin color, texture, turgor normal, no rashes or lesions  Lymph nodes:   Cervical, supraclavicular, and axillary nodes normal  Neurologic:   CNII-XII intact, normal strength, sensation and reflexes throughout    Assessment:    Pregnancy at 10 and 5/7 weeks   URI symptoms Rubella non-immune  Plan:    Initial labs reviewed. CBC ordered.  Will need MMR performed postpartum.  Prenatal vitamins encouraged. Problem list reviewed and  updated. New OB counseling:  The patient has been given an overview regarding routine prenatal care.  Recommendations regarding diet, weight gain, and exercise in pregnancy were given. Discussed safe medications to take in pregnancy for URI symptoms.  Prenatal testing, optional genetic testing, and ultrasound use in pregnancy were reviewed.  1st trimester, 2nd trimester, and cell-free DNA genetic testing discussed: requested cell-free DNA testing with [facility]. Benefits of Breast Feeding were discussed. The patient is encouraged to consider nursing her baby post partum. Follow up in 4 weeks.  50% of 30 min visit spent on counseling and coordination of care.     Rubie Maid, MD [facility]

## 2018-02-22 NOTE — Progress Notes (Signed)
NOB PE stated that she has some sinus issues and took a mucinex for the issue. Pt stated that she was doing well other than the sinus issues.

## 2018-02-23 LAB — CBC
Hematocrit: 40.3 % (ref 34.0–46.6)
Hemoglobin: 13.9 g/dL (ref 11.1–15.9)
MCH: 30.9 pg (ref 26.6–33.0)
MCHC: 34.5 g/dL (ref 31.5–35.7)
MCV: 90 fL (ref 79–97)
Platelets: 289 10*3/uL (ref 150–450)
RBC: 4.5 x10E6/uL (ref 3.77–5.28)
RDW: 12.9 % (ref 12.3–15.4)
WBC: 9.9 10*3/uL (ref 3.4–10.8)

## 2018-02-24 NOTE — L&D Delivery Note (Signed)
       Delivery Note   Katrina Tate is a 33 y.o. G1P0 at [redacted]w[redacted]d Estimated Date of Delivery: 09/15/18  PRE-OPERATIVE DIAGNOSIS:  1) [redacted]w[redacted]d pregnancy.    POST-OPERATIVE DIAGNOSIS:  1) [redacted]w[redacted]d pregnancy s/p Vaginal, Spontaneous   Delivery Type: Vaginal, Spontaneous    Delivery Anesthesia: Epidural;Local   Labor Complications:      ESTIMATED BLOOD LOSS: 150  ml    FINDINGS:   1) female infant, Apgar scores of 8   at 1 minute and 9   at 5 minutes and a birthweight of 140.04  ounces.    2) Nuchal cord: Yes  SPECIMENS:   PLACENTA:   Appearance: Intact    Removal: Spontaneous      Disposition:    DISPOSITION:  Infant to left in stable condition in the delivery room, with L&D personnel and mother,  NARRATIVE SUMMARY: Labor course:  Ms. Katrina Tate is a G1P0 at [redacted]w[redacted]d who presented for labor management, ROM.  She progressed well in labor without pitocin.  She lost control at 10cm and received an epidural.  She evidenced poor maternal expulsive effort during second stage and pushed for several hours. . She went on to deliver a viable infant. The placenta delivered without problems and was noted to be complete. A perineal and vaginal examination was performed. Episiotomy/Lacerations: 2nd degree  Episiotomy or lacerations were repaired with Vicryl suture using local anesthesia. The patient tolerated this well.  Finis Bud, M.D. 09/11/2018 10:33 AM

## 2018-02-26 LAB — CYTOLOGY - PAP
Diagnosis: NEGATIVE
HPV: NOT DETECTED

## 2018-03-08 ENCOUNTER — Telehealth: Payer: Self-pay

## 2018-03-08 NOTE — Telephone Encounter (Signed)
Pt called and informed of genetic testing and sex of her baby.

## 2018-03-23 ENCOUNTER — Encounter: Payer: Self-pay | Admitting: Obstetrics and Gynecology

## 2018-03-23 ENCOUNTER — Ambulatory Visit (INDEPENDENT_AMBULATORY_CARE_PROVIDER_SITE_OTHER): Payer: BLUE CROSS/BLUE SHIELD | Admitting: Obstetrics and Gynecology

## 2018-03-23 VITALS — BP 129/79 | HR 94 | Wt 207.4 lb

## 2018-03-23 DIAGNOSIS — Z3402 Encounter for supervision of normal first pregnancy, second trimester: Secondary | ICD-10-CM | POA: Diagnosis not present

## 2018-03-23 LAB — POCT URINALYSIS DIPSTICK OB
Bilirubin, UA: NEGATIVE
Blood, UA: NEGATIVE
Glucose, UA: NEGATIVE
Ketones, UA: NEGATIVE
Nitrite, UA: NEGATIVE
POC,PROTEIN,UA: NEGATIVE
Spec Grav, UA: 1.01 (ref 1.010–1.025)
Urobilinogen, UA: 0.2 E.U./dL
pH, UA: 6 (ref 5.0–8.0)

## 2018-03-23 NOTE — Progress Notes (Signed)
Patient comes in today for a ROB. States that she is a little bit constipated.

## 2018-03-23 NOTE — Progress Notes (Signed)
ROB: No complaints.  Patient feeling better.  Taking vitamins as directed.  Considering AFP at next visit.

## 2018-04-18 ENCOUNTER — Encounter: Payer: Self-pay | Admitting: Certified Nurse Midwife

## 2018-04-20 ENCOUNTER — Encounter: Payer: BLUE CROSS/BLUE SHIELD | Admitting: Obstetrics and Gynecology

## 2018-04-26 ENCOUNTER — Telehealth: Payer: Self-pay | Admitting: Obstetrics and Gynecology

## 2018-04-26 DIAGNOSIS — Z3402 Encounter for supervision of normal first pregnancy, second trimester: Secondary | ICD-10-CM

## 2018-04-26 NOTE — Telephone Encounter (Signed)
The patient rescheduled her last appointment with Dr. Valentino Saxon and her [place] was not scheduled and she is 20 weeks.  The order needs to be placed so we can call over to [facility] and schedule her, please advise, thanks.

## 2018-04-26 NOTE — Telephone Encounter (Signed)
Order has been placed.

## 2018-04-30 ENCOUNTER — Ambulatory Visit: Payer: Self-pay

## 2018-05-03 ENCOUNTER — Encounter: Payer: Self-pay | Admitting: Obstetrics and Gynecology

## 2018-05-03 ENCOUNTER — Ambulatory Visit (INDEPENDENT_AMBULATORY_CARE_PROVIDER_SITE_OTHER): Payer: BLUE CROSS/BLUE SHIELD | Admitting: Obstetrics and Gynecology

## 2018-05-03 VITALS — BP 122/75 | HR 98 | Wt 213.3 lb

## 2018-05-03 DIAGNOSIS — K056 Periodontal disease, unspecified: Secondary | ICD-10-CM

## 2018-05-03 DIAGNOSIS — G5600 Carpal tunnel syndrome, unspecified upper limb: Secondary | ICD-10-CM

## 2018-05-03 DIAGNOSIS — O26899 Other specified pregnancy related conditions, unspecified trimester: Secondary | ICD-10-CM

## 2018-05-03 DIAGNOSIS — Z3402 Encounter for supervision of normal first pregnancy, second trimester: Secondary | ICD-10-CM

## 2018-05-03 DIAGNOSIS — O26892 Other specified pregnancy related conditions, second trimester: Secondary | ICD-10-CM

## 2018-05-03 LAB — POCT URINALYSIS DIPSTICK OB
Bilirubin, UA: NEGATIVE
Blood, UA: NEGATIVE
Glucose, UA: NEGATIVE
Ketones, UA: NEGATIVE
LEUKOCYTES UA: NEGATIVE
Nitrite, UA: NEGATIVE
POC,PROTEIN,UA: NEGATIVE
Spec Grav, UA: 1.02 (ref 1.010–1.025)
Urobilinogen, UA: 0.2 E.U./dL
pH, UA: 6.5 (ref 5.0–8.0)

## 2018-05-03 NOTE — Progress Notes (Signed)
ROB: Notes soreness of gums, but denies toothache. Getting better since using a mouthwash.  She also notes craving more red meat, wonders if her iron levels are low. Will change to PNV with iron.  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 Declines flu vaccine. RTC in 4 weeks, for anatomy scan tomorrow.

## 2018-05-03 NOTE — Progress Notes (Signed)
ROB-Pt stated that she is doing well other than sore gums. Pt declined flu vaccine due to past h/o of illness afterwards. Pt stated that she is also feeling some tingling in her fingers.

## 2018-05-04 ENCOUNTER — Ambulatory Visit
Admission: RE | Admit: 2018-05-04 | Discharge: 2018-05-04 | Disposition: A | Discharge status: Home / Self Care | Payer: BLUE CROSS/BLUE SHIELD | Source: Ambulatory Visit | Attending: Obstetrics and Gynecology | Admitting: Obstetrics and Gynecology

## 2018-05-04 ENCOUNTER — Other Ambulatory Visit: Payer: Self-pay

## 2018-05-04 DIAGNOSIS — Z3402 Encounter for supervision of normal first pregnancy, second trimester: Secondary | ICD-10-CM | POA: Diagnosis not present

## 2018-05-31 ENCOUNTER — Encounter: Payer: BLUE CROSS/BLUE SHIELD | Admitting: Obstetrics and Gynecology

## 2018-06-01 ENCOUNTER — Encounter: Payer: BLUE CROSS/BLUE SHIELD | Admitting: Obstetrics and Gynecology

## 2018-06-07 ENCOUNTER — Telehealth: Payer: Self-pay

## 2018-06-07 NOTE — Telephone Encounter (Signed)
Pt called no answer LM to call the office to go over COVID-19 symptoms questions before her visit to the office tomorrow.

## 2018-06-08 ENCOUNTER — Ambulatory Visit (INDEPENDENT_AMBULATORY_CARE_PROVIDER_SITE_OTHER): Payer: BLUE CROSS/BLUE SHIELD | Admitting: Obstetrics and Gynecology

## 2018-06-08 ENCOUNTER — Other Ambulatory Visit: Payer: Self-pay

## 2018-06-08 VITALS — BP 108/68 | HR 97 | Ht 68.0 in | Wt 218.4 lb

## 2018-06-08 DIAGNOSIS — Z3402 Encounter for supervision of normal first pregnancy, second trimester: Secondary | ICD-10-CM

## 2018-06-08 LAB — POCT URINALYSIS DIPSTICK OB
Bilirubin, UA: NEGATIVE
Blood, UA: NEGATIVE
Glucose, UA: NEGATIVE
Ketones, UA: NEGATIVE
Leukocytes, UA: NEGATIVE
Nitrite, UA: NEGATIVE
POC,PROTEIN,UA: NEGATIVE
Spec Grav, UA: 1.015 (ref 1.010–1.025)
Urobilinogen, UA: 1 E.U./dL
pH, UA: 7 (ref 5.0–8.0)

## 2018-06-08 NOTE — Patient Instructions (Signed)
Q: Why are visitor restrictions different for maternity care areas? [place]  is restricting visitors for the duration of the patient's hospitalization. The birth of a child involves the mother, considered the patient, and a birthing partner. These are unprecedented times and we are making the exception to allow a birthing partner to be a part of the patient unit. No other guests will be allowed in our [facility] at [facility] and at [facility].  Q: Are credentialed doulas allowed to support their existing patients? We acknowledge the value these doula partnerships offer our care teams and many birthing families in our communities. Each laboring mother is allowed one birthing partner of the patient's choosing for her entire hospitalization.  Q: Are visitor restrictions different for hospitalized children? Pediatric patients (infants and children under 17 years of age), such as those in the Children's Unit, Pediatric ICU and NICU, will be allowed two visitors (parents or legal guardians)  Q: Are pregnant women at an increased risk for COVID-19? The American College of Obstetricians and Gynecologists (ACOG) is monitoring closely the coronavirus pandemic. With the limited information available, data does not indicate pregnant women are at an increased risk. However, pregnant women are known to be at greater risk for respiratory infections like flu. With that in mind, expectant mothers are considered an at-risk population for COVID-19, according to ACOG.  Q: Are newborns at an increased risk for COVID-19? A limited sample of COVID-19 data with newborns indicates the virus is not transferred to the infant during pregnancy. However, postpartum separation is recommended by the Centers for Disease Control (CDC). As a result [place] recommends and strongly encourages temporary separation of moms and babies who test positive for COVID-19 or are awaiting results to rule out  COVID-19 based on CDC guidelines.  Q: If you have a suspected case of COVID-19, is the NICU couplet care room an option? No. If either patient is considered at-risk for having COVID-19, the [facility] at [facility] will not use the NICU couplet care rooms for that family.  Q: [place] is urging that elective procedures be postponed. What is considered elective for women's and children's service line? NOT ELECTIVE: Obstetric procedures, even those with an element of choice on timing, are not considered elective. Circumcisions are considered elective procedures, however, these do not deplete blood products and other resources, which is the spirit in which the COVID-19 postponement of elective procedures was intended. Therefore, circumcisions will be allowed.  ELECTIVE: Postpartum tubal ligations are considered elective and should be postponed.   supports as much as possible the medical care team working with the patient's individual needs to address timing during these unprecedented times. We seek the support of our medical care team in preserving needed resources throughout our crisis response to COVID-19.  Q: How does COVID-19 impact breastfeeding? Breastmilk is safe for your baby - even if the mother has tested positive for COVID-19. We suggest the mother pump her milk and have a healthy family member feed the baby to protect the baby from getting the virus.  If a COVID-19+ mother decides to breastfeed after discharge, we suggest proper protective equipment be worn and hand hygiene be performed before and after feeding the infant.  Q: Should we urge patients to avoid baby showers and large gatherings? Yes. As has been recommended for all citizens in our communities, gatherings of 10 or more should be avoided - pregnant or not. Seek creative options   for "hosting" baby showers through electronic means that honor the request for social distancing during this  time of heightened awareness.  Q: Should patients miss their prenatal appointments? No. Prenatal visits are NOT elective. While we want to limit contact and exposure, prenatal care is vital right now. Contact your physician's office if you have concerns about your visits. We are limiting outpatient office visits to the patient and one guest in order to reduce the potential for exposure.  Q: What if a pregnant woman feels sick? Should she miss her prenatal visit then? A pregnant woman experiencing coronavirus-like symptoms (i.e., cough, fever, difficulty breathing, shortness of breath, gastrointestinal issues) should contact her pregnancy care provider by phone. Her medical professional can best determine whether she should use a video visit or possibly go to a collection site to be tested for COVID-19. Contacting her primary care provider or her pregnancy care provider is her first step.  Q: What can I do about childbirth education? All the classes are cancelled. The [facility] Centers will offer online learning to support mothers on their journey.  We currently offer Understanding Childbirth, Understanding Breastfeeding and Understanding Newborn Care as an online class.  Please visit our website, [email or web address], to register for an online class.  Q: How can I keep from getting COVID-19? Together, we can reduce the risk of exposure to the virus and help you and your family remain healthy and safe. One of the best ways to protect yourself is to wash your hands frequently using soap and water. Also, you should avoid touching your eyes, nose and mouth with unwashed hands, avoid physical contact with others and practice social distancing.   

## 2018-06-08 NOTE — Progress Notes (Signed)
ROB: Patient still has occasional carpal tunnel-we have discussed this in detail and with use of neutral wrist splints discussed.  Patient needs 1 hour GCT in 3 weeks. (Did not have time to do it today).  Reports active daily fetal movement.  Ultrasound in 3 weeks for follow-up.

## 2018-06-08 NOTE — Progress Notes (Signed)
Patient comes in today for ROB visit. She is having some tingling in her hands in the AM. She declined the Tdap shot. She states that she just had one about a year ago.

## 2018-06-29 ENCOUNTER — Ambulatory Visit (INDEPENDENT_AMBULATORY_CARE_PROVIDER_SITE_OTHER): Payer: BLUE CROSS/BLUE SHIELD

## 2018-06-29 ENCOUNTER — Other Ambulatory Visit: Payer: BLUE CROSS/BLUE SHIELD

## 2018-06-29 ENCOUNTER — Other Ambulatory Visit: Payer: Self-pay

## 2018-06-29 ENCOUNTER — Other Ambulatory Visit: Payer: Self-pay | Admitting: Obstetrics and Gynecology

## 2018-06-29 DIAGNOSIS — Z3402 Encounter for supervision of normal first pregnancy, second trimester: Secondary | ICD-10-CM | POA: Diagnosis not present

## 2018-06-30 LAB — CBC
Hematocrit: 38.1 % (ref 34.0–46.6)
Hemoglobin: 13.3 g/dL (ref 11.1–15.9)
MCH: 32 pg (ref 26.6–33.0)
MCHC: 34.9 g/dL (ref 31.5–35.7)
MCV: 92 fL (ref 79–97)
Platelets: 240 10*3/uL (ref 150–450)
RBC: 4.15 x10E6/uL (ref 3.77–5.28)
RDW: 13.1 % (ref 11.7–15.4)
WBC: 10 10*3/uL (ref 3.4–10.8)

## 2018-06-30 LAB — RPR: RPR Ser Ql: NONREACTIVE

## 2018-06-30 LAB — GLUCOSE, 1 HOUR GESTATIONAL: Gestational Diabetes Screen: 116 mg/dL (ref 65–139)

## 2018-07-06 ENCOUNTER — Telehealth: Payer: Self-pay | Admitting: Obstetrics and Gynecology

## 2018-07-06 NOTE — Telephone Encounter (Signed)
The patient called and stated she would like to speak with a nurse in regards to her wanting to know if pain in her rib area is normal. Please advise.

## 2018-07-06 NOTE — Telephone Encounter (Signed)
Spoke with patient and she stated that she started having left rib pain Sunday after work. She said that the pain dulled over night. She denies any heavy lifting or cough. I advised her to take tylenol to see if this will help with the pain. She has not taken anything for this. She will call back if the tylenol does not help.

## 2018-07-13 ENCOUNTER — Ambulatory Visit (INDEPENDENT_AMBULATORY_CARE_PROVIDER_SITE_OTHER): Payer: BLUE CROSS/BLUE SHIELD | Admitting: Obstetrics and Gynecology

## 2018-07-13 ENCOUNTER — Other Ambulatory Visit: Payer: Self-pay

## 2018-07-13 ENCOUNTER — Encounter: Payer: Self-pay | Admitting: Obstetrics and Gynecology

## 2018-07-13 VITALS — BP 111/74 | HR 94 | Wt 224.8 lb

## 2018-07-13 DIAGNOSIS — R0781 Pleurodynia: Secondary | ICD-10-CM

## 2018-07-13 DIAGNOSIS — Z3403 Encounter for supervision of normal first pregnancy, third trimester: Secondary | ICD-10-CM

## 2018-07-13 DIAGNOSIS — O9989 Other specified diseases and conditions complicating pregnancy, childbirth and the puerperium: Secondary | ICD-10-CM

## 2018-07-13 DIAGNOSIS — M7989 Other specified soft tissue disorders: Secondary | ICD-10-CM

## 2018-07-13 LAB — POCT URINALYSIS DIPSTICK OB
Bilirubin, UA: NEGATIVE
Blood, UA: NEGATIVE
Glucose, UA: NEGATIVE
Ketones, UA: NEGATIVE
Nitrite, UA: NEGATIVE
Spec Grav, UA: 1.025 (ref 1.010–1.025)
Urobilinogen, UA: 0.2 E.U./dL
pH, UA: 6.5 (ref 5.0–8.0)

## 2018-07-13 NOTE — Progress Notes (Signed)
ROB: Patient complains of rib pain x 1 week.  Is taking Tylenol ES.  Also noting feet swelling while working, advised on compression stockings, elevating feet in the evening. Notes skin changes on breasts (blotchy dark spots), discussed that this was normal due to hormones of pregnancy. Discussed recent ultrasound still noting some mild bilateral pyelectasis.  Discussed COVID in pregnancy. Normal glucola results. RTC in 2 weeks.

## 2018-07-13 NOTE — Patient Instructions (Signed)

## 2018-07-13 NOTE — Progress Notes (Signed)
ROB-Pt present today for prenatal care. Pt stated that for about 2 days she had a sharp pain in her left rib cage area. Pt stated that now it feels like the area is bruised. No other problems.

## 2018-07-26 ENCOUNTER — Telehealth: Payer: Self-pay

## 2018-07-26 NOTE — Telephone Encounter (Signed)
Pt called no answer LM via voicemail to call the office to do prescreening; sent mychart message.

## 2018-07-27 ENCOUNTER — Encounter: Payer: Self-pay | Admitting: Obstetrics and Gynecology

## 2018-07-27 ENCOUNTER — Ambulatory Visit (INDEPENDENT_AMBULATORY_CARE_PROVIDER_SITE_OTHER): Payer: BLUE CROSS/BLUE SHIELD | Admitting: Obstetrics and Gynecology

## 2018-07-27 ENCOUNTER — Other Ambulatory Visit: Payer: Self-pay

## 2018-07-27 VITALS — BP 104/75 | HR 120 | Ht 68.0 in | Wt 223.3 lb

## 2018-07-27 DIAGNOSIS — Z3403 Encounter for supervision of normal first pregnancy, third trimester: Secondary | ICD-10-CM

## 2018-07-27 LAB — POCT URINALYSIS DIPSTICK OB
Bilirubin, UA: NEGATIVE
Blood, UA: NEGATIVE
Glucose, UA: NEGATIVE
Ketones, UA: NEGATIVE
Nitrite, UA: NEGATIVE
Spec Grav, UA: 1.01 (ref 1.010–1.025)
Urobilinogen, UA: 0.2 E.U./dL
pH, UA: 7 (ref 5.0–8.0)

## 2018-07-27 NOTE — Progress Notes (Signed)
ROB: Patient without complaint.  Taking her vitamins.  Daily fetal movement.

## 2018-07-27 NOTE — Addendum Note (Signed)
Addended by: Dorian Pod on: 07/27/2018 03:03 PM   Modules accepted: Orders

## 2018-07-27 NOTE — Progress Notes (Signed)
Patient comes in today for ROB visit. She has no concerns today.  

## 2018-08-17 ENCOUNTER — Encounter: Payer: Self-pay | Admitting: Obstetrics and Gynecology

## 2018-08-17 ENCOUNTER — Ambulatory Visit (INDEPENDENT_AMBULATORY_CARE_PROVIDER_SITE_OTHER): Payer: BC Managed Care – PPO | Admitting: Obstetrics and Gynecology

## 2018-08-17 ENCOUNTER — Other Ambulatory Visit: Payer: Self-pay

## 2018-08-17 VITALS — BP 120/81 | HR 94 | Wt 226.7 lb

## 2018-08-17 DIAGNOSIS — Z3403 Encounter for supervision of normal first pregnancy, third trimester: Secondary | ICD-10-CM

## 2018-08-17 DIAGNOSIS — Z0289 Encounter for other administrative examinations: Secondary | ICD-10-CM

## 2018-08-17 LAB — POCT URINALYSIS DIPSTICK OB
Bilirubin, UA: NEGATIVE
Blood, UA: NEGATIVE
Glucose, UA: NEGATIVE
Ketones, UA: NEGATIVE
Nitrite, UA: NEGATIVE
Spec Grav, UA: 1.025 (ref 1.010–1.025)
Urobilinogen, UA: 0.2 E.U./dL
pH, UA: 6 (ref 5.0–8.0)

## 2018-08-17 NOTE — Progress Notes (Signed)
ROB-Pt is present today for prenatal care and 36 weeks cultures. PT stated that she is doing well no complaints.

## 2018-08-17 NOTE — Progress Notes (Signed)
ROB: Patient doing well.  Notes that she has taken her leave from work due to it being stressful and being on her feet 8 hrs today and lifting. Also notes working during [place] has also been very stressful. Her job does allow for up to 16 weeks of leave. FMLA papers to be completed. 36 week labs done today.  Plans to breastfeed and bottle feed. Desires no method for contraception (notes husband may be considering vasectomy). 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 Declined Tdap. Blood transfusion consent form signed. RTC in 1 week. Discussed labor precautions.

## 2018-08-19 LAB — GC/CHLAMYDIA PROBE AMP
Chlamydia trachomatis, NAA: NEGATIVE
Neisseria Gonorrhoeae by PCR: NEGATIVE

## 2018-08-19 LAB — STREP GP B NAA: Strep Gp B NAA: POSITIVE — AB

## 2018-08-24 ENCOUNTER — Ambulatory Visit (INDEPENDENT_AMBULATORY_CARE_PROVIDER_SITE_OTHER): Payer: BC Managed Care – PPO | Admitting: Obstetrics and Gynecology

## 2018-08-24 ENCOUNTER — Other Ambulatory Visit: Payer: Self-pay

## 2018-08-24 ENCOUNTER — Encounter: Payer: Self-pay | Admitting: Obstetrics and Gynecology

## 2018-08-24 VITALS — BP 136/84 | HR 89 | Ht 68.0 in | Wt 229.8 lb

## 2018-08-24 DIAGNOSIS — Z3403 Encounter for supervision of normal first pregnancy, third trimester: Secondary | ICD-10-CM

## 2018-08-24 LAB — POCT URINALYSIS DIPSTICK OB
Bilirubin, UA: NEGATIVE
Blood, UA: NEGATIVE
Glucose, UA: NEGATIVE
Ketones, UA: NEGATIVE
Leukocytes, UA: NEGATIVE
Nitrite, UA: NEGATIVE
POC,PROTEIN,UA: NEGATIVE
Spec Grav, UA: 1.01 (ref 1.010–1.025)
Urobilinogen, UA: 0.2 E.U./dL
pH, UA: 5.5 (ref 5.0–8.0)

## 2018-08-24 NOTE — Progress Notes (Signed)
ROB: Signs and symptoms of labor discussed.  GBS positive discussed.  Patient complains of occasional back pain but otherwise doing well.

## 2018-08-30 ENCOUNTER — Telehealth: Payer: Self-pay

## 2018-08-31 ENCOUNTER — Ambulatory Visit (INDEPENDENT_AMBULATORY_CARE_PROVIDER_SITE_OTHER): Payer: BC Managed Care – PPO | Admitting: Obstetrics and Gynecology

## 2018-08-31 ENCOUNTER — Encounter: Payer: Self-pay | Admitting: Certified Nurse Midwife

## 2018-08-31 ENCOUNTER — Other Ambulatory Visit: Payer: Self-pay

## 2018-08-31 VITALS — BP 109/71 | HR 81 | Wt 229.6 lb

## 2018-08-31 DIAGNOSIS — Z3403 Encounter for supervision of normal first pregnancy, third trimester: Secondary | ICD-10-CM

## 2018-08-31 LAB — POCT URINALYSIS DIPSTICK OB
Bilirubin, UA: NEGATIVE
Blood, UA: NEGATIVE
Glucose, UA: NEGATIVE
Ketones, UA: NEGATIVE
Leukocytes, UA: NEGATIVE
Nitrite, UA: NEGATIVE
POC,PROTEIN,UA: NEGATIVE
Spec Grav, UA: 1.02 (ref 1.010–1.025)
Urobilinogen, UA: 0.2 E.U./dL
pH, UA: 6 (ref 5.0–8.0)

## 2018-08-31 NOTE — Patient Instructions (Signed)
Braxton Hicks Contractions Contractions of the uterus can occur throughout pregnancy, but they are not always a sign that you are in labor. You may have practice contractions called Braxton Hicks contractions. These false labor contractions are sometimes confused with true labor. What are Braxton Hicks contractions? Braxton Hicks contractions are tightening movements that occur in the muscles of the uterus before labor. Unlike true labor contractions, these contractions do not result in opening (dilation) and thinning of the cervix. Toward the end of pregnancy (32-34 weeks), Braxton Hicks contractions can happen more often and may become stronger. These contractions are sometimes difficult to tell apart from true labor because they can be very uncomfortable. You should not feel embarrassed if you go to the hospital with false labor. Sometimes, the only way to tell if you are in true labor is for your health care provider to look for changes in the cervix. The health care provider will do a physical exam and may monitor your contractions. If you are not in true labor, the exam should show that your cervix is not dilating and your water has not broken. If there are no other health problems associated with your pregnancy, it is completely safe for you to be sent home with false labor. You may continue to have Braxton Hicks contractions until you go into true labor. How to tell the difference between true labor and false labor True labor  Contractions last 30-70 seconds.  Contractions become very regular.  Discomfort is usually felt in the top of the uterus, and it spreads to the lower abdomen and low back.  Contractions do not go away with walking.  Contractions usually become more intense and increase in frequency.  The cervix dilates and gets thinner. False labor  Contractions are usually shorter and not as strong as true labor contractions.  Contractions are usually irregular.  Contractions  are often felt in the front of the lower abdomen and in the groin.  Contractions may go away when you walk around or change positions while lying down.  Contractions get weaker and are shorter-lasting as time goes on.  The cervix usually does not dilate or become thin. Follow these instructions at home:   Take over-the-counter and prescription medicines only as told by your health care provider.  Keep up with your usual exercises and follow other instructions from your health care provider.  Eat and drink lightly if you think you are going into labor.  If Braxton Hicks contractions are making you uncomfortable: ? Change your position from lying down or resting to walking, or change from walking to resting. ? Sit and rest in a tub of warm water. ? Drink enough fluid to keep your urine pale yellow. Dehydration may cause these contractions. ? Do slow and deep breathing several times an hour.  Keep all follow-up prenatal visits as told by your health care provider. This is important. Contact a health care provider if:  You have a fever.  You have continuous pain in your abdomen. Get help right away if:  Your contractions become stronger, more regular, and closer together.  You have fluid leaking or gushing from your vagina.  You pass blood-tinged mucus (bloody show).  You have bleeding from your vagina.  You have low back pain that you never had before.  You feel your baby's head pushing down and causing pelvic pressure.  Your baby is not moving inside you as much as it used to. Summary  Contractions that occur before labor are   called Braxton Hicks contractions, false labor, or practice contractions.  Braxton Hicks contractions are usually shorter, weaker, farther apart, and less regular than true labor contractions. True labor contractions usually become progressively stronger and regular, and they become more frequent.  Manage discomfort from Braxton Hicks contractions  by changing position, resting in a warm bath, drinking plenty of water, or practicing deep breathing. This information is not intended to replace advice given to you by your health care provider. Make sure you discuss any questions you have with your health care provider. Document Released: 06/26/2016 Document Revised: 01/23/2017 Document Reviewed: 06/26/2016 Elsevier Patient Education  2020 Elsevier Inc.  

## 2018-08-31 NOTE — Telephone Encounter (Signed)
error 

## 2018-08-31 NOTE — Progress Notes (Signed)
ROB: No complaints.  Denies contractions.  Patient strongly considering bottlefeeding- discussed breast-feeding in detail patient says she will "consider it".

## 2018-09-10 ENCOUNTER — Telehealth: Payer: Self-pay | Admitting: Obstetrics and Gynecology

## 2018-09-10 NOTE — Telephone Encounter (Signed)
The patient called and stated that she has lost her mucus plug and she has some questions and concerns she would like to ask her nurse today if possible. Pt is requesting a call back. Please advise.

## 2018-09-10 NOTE — Telephone Encounter (Signed)
Called patient back and she has lost her mucus plug. She has not had any contractions from what she says. I told her that if she feels like she is having contractions to go to the hospital. Patient verbalized understanding.

## 2018-09-11 ENCOUNTER — Inpatient Hospital Stay
Admission: EM | Admit: 2018-09-11 | Discharge: 2018-09-12 | DRG: 807 | Disposition: A | Discharge status: Home / Self Care | Payer: BC Managed Care – PPO | Attending: Obstetrics and Gynecology | Admitting: Obstetrics and Gynecology

## 2018-09-11 ENCOUNTER — Inpatient Hospital Stay: Payer: BC Managed Care – PPO | Admitting: Anesthesiology

## 2018-09-11 ENCOUNTER — Other Ambulatory Visit: Payer: Self-pay

## 2018-09-11 DIAGNOSIS — Z3A39 39 weeks gestation of pregnancy: Secondary | ICD-10-CM

## 2018-09-11 DIAGNOSIS — Z1159 Encounter for screening for other viral diseases: Secondary | ICD-10-CM

## 2018-09-11 DIAGNOSIS — O4202 Full-term premature rupture of membranes, onset of labor within 24 hours of rupture: Secondary | ICD-10-CM

## 2018-09-11 DIAGNOSIS — O99214 Obesity complicating childbirth: Secondary | ICD-10-CM | POA: Diagnosis present

## 2018-09-11 DIAGNOSIS — O26893 Other specified pregnancy related conditions, third trimester: Secondary | ICD-10-CM | POA: Diagnosis present

## 2018-09-11 DIAGNOSIS — O99824 Streptococcus B carrier state complicating childbirth: Secondary | ICD-10-CM | POA: Diagnosis present

## 2018-09-11 DIAGNOSIS — Z3A37 37 weeks gestation of pregnancy: Secondary | ICD-10-CM | POA: Diagnosis not present

## 2018-09-11 DIAGNOSIS — E669 Obesity, unspecified: Secondary | ICD-10-CM | POA: Diagnosis present

## 2018-09-11 LAB — CBC
HCT: 37.1 % (ref 36.0–46.0)
Hemoglobin: 12.8 g/dL (ref 12.0–15.0)
MCH: 31.4 pg (ref 26.0–34.0)
MCHC: 34.5 g/dL (ref 30.0–36.0)
MCV: 90.9 fL (ref 80.0–100.0)
Platelets: 218 10*3/uL (ref 150–400)
RBC: 4.08 MIL/uL (ref 3.87–5.11)
RDW: 12.7 % (ref 11.5–15.5)
WBC: 10.5 10*3/uL (ref 4.0–10.5)
nRBC: 0 % (ref 0.0–0.2)

## 2018-09-11 LAB — TYPE AND SCREEN
ABO/RH(D): A POS
Antibody Screen: NEGATIVE

## 2018-09-11 LAB — SARS CORONAVIRUS 2 BY RT PCR (HOSPITAL ORDER, PERFORMED IN ~~LOC~~ HOSPITAL LAB): SARS Coronavirus 2: NEGATIVE

## 2018-09-11 MED ORDER — OXYTOCIN BOLUS FROM INFUSION
500.0000 mL | Freq: Once | INTRAVENOUS | Status: AC
  Administered 2018-09-11: 500 mL via INTRAVENOUS

## 2018-09-11 MED ORDER — OXYTOCIN 40 UNITS IN NORMAL SALINE INFUSION - SIMPLE MED: 2.5000 [IU]/h | INTRAVENOUS | Status: DC

## 2018-09-11 MED ORDER — OXYTOCIN 10 UNIT/ML IJ SOLN
INTRAMUSCULAR | Status: AC
  Filled 2018-09-11: qty 2

## 2018-09-11 MED ORDER — ACETAMINOPHEN 325 MG PO TABS: 650.0000 mg | ORAL_TABLET | ORAL | Status: DC | PRN

## 2018-09-11 MED ORDER — LIDOCAINE HCL (PF) 1 % IJ SOLN
INTRAMUSCULAR | Status: DC | PRN
  Administered 2018-09-11: 1 mL

## 2018-09-11 MED ORDER — FENTANYL 2.5 MCG/ML W/ROPIVACAINE 0.15% IN NS 100 ML EPIDURAL (ARMC)
EPIDURAL | Status: AC
  Filled 2018-09-11: qty 100

## 2018-09-11 MED ORDER — TETANUS-DIPHTH-ACELL PERTUSSIS 5-2.5-18.5 LF-MCG/0.5 IM SUSP
0.5000 mL | Freq: Once | INTRAMUSCULAR | Status: DC
  Filled 2018-09-11: qty 0.5

## 2018-09-11 MED ORDER — PHENYLEPHRINE 40 MCG/ML (10ML) SYRINGE FOR IV PUSH (FOR BLOOD PRESSURE SUPPORT): 80.0000 ug | PREFILLED_SYRINGE | INTRAVENOUS | Status: DC | PRN

## 2018-09-11 MED ORDER — ONDANSETRON HCL 4 MG/2ML IJ SOLN: 4.0000 mg | Freq: Four times a day (QID) | INTRAMUSCULAR | Status: DC | PRN

## 2018-09-11 MED ORDER — BENZOCAINE-MENTHOL 20-0.5 % EX AERO
1.0000 "application " | INHALATION_SPRAY | CUTANEOUS | Status: DC | PRN
  Filled 2018-09-11: qty 56

## 2018-09-11 MED ORDER — SOD CITRATE-CITRIC ACID 500-334 MG/5ML PO SOLN: 30.0000 mL | ORAL | Status: DC | PRN

## 2018-09-11 MED ORDER — DOCUSATE SODIUM 100 MG PO CAPS
100.0000 mg | ORAL_CAPSULE | Freq: Two times a day (BID) | ORAL | Status: DC
  Administered 2018-09-11 – 2018-09-12 (×2): 100 mg via ORAL
  Filled 2018-09-11 (×2): qty 1

## 2018-09-11 MED ORDER — DIPHENHYDRAMINE HCL 50 MG/ML IJ SOLN: 12.5000 mg | INTRAMUSCULAR | Status: DC | PRN

## 2018-09-11 MED ORDER — ZOLPIDEM TARTRATE 5 MG PO TABS: 5.0000 mg | ORAL_TABLET | Freq: Every evening | ORAL | Status: DC | PRN

## 2018-09-11 MED ORDER — AMMONIA AROMATIC IN INHA
RESPIRATORY_TRACT | Status: AC
  Filled 2018-09-11: qty 10

## 2018-09-11 MED ORDER — SODIUM CHLORIDE 0.9 % IV SOLN
1.0000 g | Freq: Once | INTRAVENOUS | Status: AC
  Administered 2018-09-11: 09:00:00 1 g via INTRAVENOUS

## 2018-09-11 MED ORDER — LIDOCAINE HCL (PF) 1 % IJ SOLN: 30.0000 mL | INTRAMUSCULAR | Status: DC | PRN

## 2018-09-11 MED ORDER — EPHEDRINE 5 MG/ML INJ: 10.0000 mg | INTRAVENOUS | Status: DC | PRN

## 2018-09-11 MED ORDER — SODIUM CHLORIDE 0.9 % IV SOLN
INTRAVENOUS | Status: AC
  Administered 2018-09-11: 09:00:00 1 g via INTRAVENOUS
  Filled 2018-09-11: qty 1000

## 2018-09-11 MED ORDER — SODIUM CHLORIDE 0.9 % IV SOLN
2.0000 g | Freq: Once | INTRAVENOUS | Status: AC
  Administered 2018-09-11: 05:00:00 2 g via INTRAVENOUS
  Filled 2018-09-11: qty 2000

## 2018-09-11 MED ORDER — BUTORPHANOL TARTRATE 2 MG/ML IJ SOLN
1.0000 mg | INTRAMUSCULAR | Status: DC | PRN
  Administered 2018-09-11: 05:00:00 1 mg via INTRAVENOUS
  Filled 2018-09-11: qty 1

## 2018-09-11 MED ORDER — SODIUM CHLORIDE 0.9 % IV SOLN
INTRAVENOUS | Status: DC | PRN
  Administered 2018-09-11 (×3): 5 mL via EPIDURAL

## 2018-09-11 MED ORDER — OXYTOCIN 40 UNITS IN NORMAL SALINE INFUSION - SIMPLE MED
INTRAVENOUS | Status: AC
  Administered 2018-09-11: 500 mL via INTRAVENOUS
  Filled 2018-09-11: qty 1000

## 2018-09-11 MED ORDER — PRENATAL MULTIVITAMIN CH
1.0000 | ORAL_TABLET | Freq: Every day | ORAL | Status: DC
  Administered 2018-09-11 – 2018-09-12 (×2): 1 via ORAL
  Filled 2018-09-11 (×2): qty 1

## 2018-09-11 MED ORDER — DIPHENHYDRAMINE HCL 25 MG PO CAPS: 25.0000 mg | ORAL_CAPSULE | Freq: Four times a day (QID) | ORAL | Status: DC | PRN

## 2018-09-11 MED ORDER — OXYTOCIN 40 UNITS IN NORMAL SALINE INFUSION - SIMPLE MED
2.5000 [IU]/h | INTRAVENOUS | Status: DC | PRN
  Filled 2018-09-11: qty 1000

## 2018-09-11 MED ORDER — LACTATED RINGERS IV SOLN
500.0000 mL | INTRAVENOUS | Status: DC | PRN
  Administered 2018-09-11: 1000 mL via INTRAVENOUS

## 2018-09-11 MED ORDER — IBUPROFEN 600 MG PO TABS
600.0000 mg | ORAL_TABLET | Freq: Four times a day (QID) | ORAL | Status: DC
  Administered 2018-09-11 – 2018-09-12 (×5): 600 mg via ORAL
  Filled 2018-09-11 (×5): qty 1

## 2018-09-11 MED ORDER — LACTATED RINGERS IV SOLN: INTRAVENOUS | Status: DC

## 2018-09-11 MED ORDER — LIDOCAINE HCL (PF) 1 % IJ SOLN
INTRAMUSCULAR | Status: AC
  Filled 2018-09-11: qty 30

## 2018-09-11 MED ORDER — OXYCODONE-ACETAMINOPHEN 5-325 MG PO TABS: 1.0000 | ORAL_TABLET | ORAL | Status: DC | PRN

## 2018-09-11 MED ORDER — LACTATED RINGERS IV SOLN: 500.0000 mL | Freq: Once | INTRAVENOUS | Status: DC

## 2018-09-11 MED ORDER — SIMETHICONE 80 MG PO CHEW: 80.0000 mg | CHEWABLE_TABLET | ORAL | Status: DC | PRN

## 2018-09-11 MED ORDER — FENTANYL 2.5 MCG/ML W/ROPIVACAINE 0.15% IN NS 100 ML EPIDURAL (ARMC)
12.0000 mL/h | EPIDURAL | Status: DC
  Administered 2018-09-11: 12 mL/h via EPIDURAL

## 2018-09-11 MED ORDER — MISOPROSTOL 200 MCG PO TABS
ORAL_TABLET | ORAL | Status: AC
  Filled 2018-09-11: qty 4

## 2018-09-11 NOTE — H&P (Signed)
History and Physical   HPI  Katrina Tate is a 33 y.o. G1P0 at [redacted]w[redacted]d Estimated Date of Delivery: 09/15/18 who is being admitted for SROM and  Active labor.  OB History  OB History  Gravida Para Term Preterm AB Living  1 0 0 0 0 0  SAB TAB Ectopic Multiple Live Births  0 0 0 0 0    # Outcome Date GA Lbr Len/2nd Weight Sex Delivery Anes PTL Lv  1 Current             PROBLEM LIST  Pregnancy complications or risks: Patient Active Problem List   Diagnosis Date Noted  . Normal labor 09/11/2018  . Rubella non-immune status, antepartum 02/22/2018  . Mild obesity 02/22/2018     Prenatal labs and studies: ABO, Rh: --/--/A POS (07/18 0444) Antibody: NEG (07/18 0444) Rubella: <0.90 (12/10 1558) RPR: Non Reactive (05/05 1108)  HBsAg: Negative (12/10 1558)  HIV: Non Reactive (12/10 1558)  IPJ:ASNKNLZJ (06/23 1349)   Past Medical History:  Diagnosis Date  . Shingles 1993     Past Surgical History:  Procedure Laterality Date  . WISDOM TOOTH EXTRACTION  2009     Medications    Current Discharge Medication List    CONTINUE these medications which have NOT CHANGED   Details  calcium carbonate (TUMS - DOSED IN MG ELEMENTAL CALCIUM) 500 MG chewable tablet Chew 1 tablet by mouth daily.    Prenatal Vit-Fe Fumarate-FA (MULTIVITAMIN-PRENATAL) 27-0.8 MG TABS tablet Take 1 tablet by mouth daily at 12 noon.         Allergies  Pine needles [pine tar] and Tree extract  Review of Systems  Pertinent items are noted in HPI.  Physical Exam  BP 125/78   Pulse (!) 105   Temp 98.4 F (36.9 C) (Oral)   Resp 18   Ht 5\' 8"  (1.727 m)   Wt 108 kg   LMP 12/07/2017 (Exact Date)   BMI 36.19 kg/m   Lungs:  CTA B Cardio: RRR without M/R/G Abd: Soft, gravid, NT Presentation: cephalic EXT: No C/C/ 1+ Edema DTRs: 2+ B CERVIX: Dilation: Lip/rim Effacement (%): 100 Cervical Position: Middle Station: 0, Plus 1 Exam by:: Clementeen Graham, RN  See Prenatal records  for more detailed PE.     FHR:  Variability: Good {> 6 bpm)  Toco: Uterine Contractions: Regular   Test Results  Results for orders placed or performed during the hospital encounter of 09/11/18 (from the past 24 hour(s))  SARS Coronavirus 2 (CEPHEID - Performed in [place] hospital lab), [facility] Order     Status: None   Collection Time: 09/11/18  4:20 AM   Specimen: Nasopharyngeal Swab  Result Value Ref Range   SARS Coronavirus 2 NEGATIVE NEGATIVE  CBC     Status: None   Collection Time: 09/11/18  4:44 AM  Result Value Ref Range   WBC 10.5 4.0 - 10.5 K/uL   RBC 4.08 3.87 - 5.11 MIL/uL   Hemoglobin 12.8 12.0 - 15.0 g/dL   HCT 37.1 36.0 - 46.0 %   MCV 90.9 80.0 - 100.0 fL   MCH 31.4 26.0 - 34.0 pg   MCHC 34.5 30.0 - 36.0 g/dL   RDW 12.7 11.5 - 15.5 %   Platelets 218 150 - 400 K/uL   nRBC 0.0 0.0 - 0.2 %  Type and screen [facility]     Status: None   Collection Time: 09/11/18  4:44 AM  Result Value Ref Range   ABO/RH(D) A POS    Antibody Screen NEG    Sample Expiration      09/14/2018,2359 Performed at [facility], [address] [place], [place] [address]    Group B Strep positive  Assessment   G1P0 at 125w3d Estimated Date of Delivery: 09/15/18  The fetus is reassuring.   Patient Active Problem List   Diagnosis Date Noted  . Normal labor 09/11/2018  . Rubella non-immune status, antepartum 02/22/2018  . Mild obesity 02/22/2018    Plan  1. Admit to L&D :    2. EFM: -- Category 1 3. Epidural if desired.  Stadol for IV pain until epidural requested. 4. Admission labs  5. Abx for GBS  Elonda Huskyavid J. Evans, M.D. 09/11/2018 6:00 AM

## 2018-09-11 NOTE — OB Triage Note (Signed)
Patient came in from home c/o of contractions that started at 0100am and reports gush of fluids at 0130 while getting up to the bathroom. Reports fluids to be clear and some blood tinge. Reports positive FM. Denies sexual intercourse before the contractions.

## 2018-09-11 NOTE — Anesthesia Procedure Notes (Signed)
Epidural Patient location during procedure: OB Start time: 09/11/2018 5:51 AM End time: 09/11/2018 5:59 AM  Staffing Anesthesiologist: Durenda Hurt, MD Performed: anesthesiologist   Preanesthetic Checklist Completed: patient identified, site marked, surgical consent, pre-op evaluation, timeout performed, IV checked, risks and benefits discussed and monitors and equipment checked  Epidural Patient position: sitting Prep: ChloraPrep Patient monitoring: heart rate, continuous pulse ox and blood pressure Approach: midline Location: L4-L5 Injection technique: LOR saline  Needle:  Needle type: Tuohy  Needle gauge: 18 G Needle length: 9 cm and 9 Needle insertion depth: 7 cm Catheter type: closed end flexible Catheter size: 20 Guage Catheter at skin depth: 11 cm Test dose: negative and Other  Assessment Events: blood not aspirated, injection not painful, no injection resistance, negative IV test and no paresthesia  Additional Notes Negative aspiration. Negative paresthesia on injection. Patient tolerated the insertion well without complications.Reason for block:procedure for pain

## 2018-09-11 NOTE — Anesthesia Preprocedure Evaluation (Signed)
Anesthesia Evaluation  Patient identified by MRN, date of birth, ID band Patient awake    Reviewed: Allergy & Precautions, H&P , NPO status , Patient's Chart, lab work & pertinent test results  Airway Mallampati: II  TM Distance: >3 FB Neck ROM: full    Dental  (+) Teeth Intact   Pulmonary neg pulmonary ROS,           Cardiovascular Exercise Tolerance: Good (-) hypertensionnegative cardio ROS       Neuro/Psych    GI/Hepatic negative GI ROS,   Endo/Other  neg diabetes  Renal/GU   negative genitourinary   Musculoskeletal   Abdominal   Peds  Hematology negative hematology ROS (+)   Anesthesia Other Findings Obese, BMI 36  Past Medical History: 1993: Shingles  Past Surgical History: 2009: WISDOM TOOTH EXTRACTION  BMI    Body Mass Index: 36.19 kg/m      Reproductive/Obstetrics (+) Pregnancy                             Anesthesia Physical Anesthesia Plan  ASA: III  Anesthesia Plan: Epidural   Post-op Pain Management:    Induction:   PONV Risk Score and Plan:   Airway Management Planned:   Additional Equipment:   Intra-op Plan:   Post-operative Plan:   Informed Consent: I have reviewed the patients History and Physical, chart, labs and discussed the procedure including the risks, benefits and alternatives for the proposed anesthesia with the patient or authorized representative who has indicated his/her understanding and acceptance.       Plan Discussed with: Anesthesiologist  Anesthesia Plan Comments:         Anesthesia Quick Evaluation

## 2018-09-12 NOTE — Anesthesia Postprocedure Evaluation (Signed)
Anesthesia Post Note  Patient: Probation officer  Procedure(s) Performed: AN AD [place]  Patient location during evaluation: Mother Baby Anesthesia Type: Epidural Level of consciousness: awake and alert Pain management: pain level controlled Vital Signs Assessment: post-procedure vital signs reviewed and stable Respiratory status: spontaneous breathing, nonlabored ventilation and respiratory function stable Cardiovascular status: stable Postop Assessment: no headache, no backache and epidural receding (Minimal discomfort at insertion site) Anesthetic complications: no     Last Vitals:  Vitals:   09/12/18 0739 09/12/18 1148  BP: 105/78 115/82  Pulse: 87 88  Resp: 20 20  Temp: 36.5 C 36.7 C  SpO2: 100%     Last Pain:  Vitals:   09/12/18 1148  TempSrc: Oral  PainSc:                  Alphonsus Sias

## 2018-09-12 NOTE — Discharge Summary (Signed)
                              Discharge Summary  Date of Admission: 09/11/2018  Date of Discharge: 09/12/2018  Admitting Diagnosis: Onset of Labor at [redacted]w[redacted]d  Mode of Delivery: normal spontaneous vaginal delivery                 Discharge Diagnosis: No other diagnosis   Intrapartum Procedures: epidural, episiotomy 2nd and GBS prophylaxis   Post partum procedures:   Complications: none                      Discharge Day SOAP Note:  Progress Note - Vaginal Delivery  Katrina Tate is a 33 y.o. G1P1001 now PP day 1 s/p Vaginal, Spontaneous . Delivery was uncomplicated  Subjective  The patient has the following complaints: has no unusual complaints  Pain is controlled with current medications.   Patient is urinating without difficulty.  She is ambulating well.    Objective  Vital signs: BP 105/78 (BP Location: Right Arm)   Pulse 87   Temp 97.7 F (36.5 C) (Oral)   Resp 20   Ht 5\' 8"  (1.727 m)   Wt 108 kg   LMP 12/07/2017 (Exact Date)   SpO2 100%   Breastfeeding Unknown   BMI 36.19 kg/m   Physical Exam: Gen: NAD Fundus Fundal Tone: Firm  Lochia Amount: Small  Perineum Appearance: Approximated     Data Review Labs: CBC Latest Ref Rng & Units 09/11/2018 06/29/2018 02/22/2018  WBC 4.0 - 10.5 K/uL 10.5 10.0 9.9  Hemoglobin 12.0 - 15.0 g/dL 12.8 13.3 13.9  Hematocrit 36.0 - 46.0 % 37.1 38.1 40.3  Platelets 150 - 400 K/uL 218 240 289   A POS  Assessment/Plan  Active Problems:   Normal labor    Plan for discharge today.   Discharge Instructions: Per After Visit Summary. Activity: Advance as tolerated. Pelvic rest for 6 weeks.  Also refer to After Visit Summary Diet: Regular Medications: Allergies as of 09/12/2018      Reactions   Pine Needles [pine Tar]    Tree Extract       Medication List    STOP taking these medications   calcium carbonate 500 MG chewable tablet Commonly known as: TUMS - dosed in mg elemental calcium     TAKE these  medications   multivitamin-prenatal 27-0.8 MG Tabs tablet Take 1 tablet by mouth daily at 12 noon.      Outpatient follow up:  Follow-up Information    Harlin Heys, MD Follow up in 6 week(s).   Specialties: Obstetrics and Gynecology, Radiology Contact information: [address] [place] [address] [phone]          Postpartum contraception: Will discuss at first office visit post-partum  Discharged Condition: good  Discharged to: home  Newborn Data: Disposition:home with mother  Apgars: APGAR (1 MIN): 8   APGAR (5 MINS): 9   APGAR (10 MINS):    Baby Feeding: Bottle    Finis Bud, M.D. 09/12/2018 9:13 AM

## 2018-09-12 NOTE — Progress Notes (Signed)
TDAP and MMR vaccine offered. Patient declined both. Discharge instructions given. Patient verbalizes understanding of teaching. Patient discharged home via wheelchair at 1730.

## 2018-09-14 ENCOUNTER — Encounter: Payer: BC Managed Care – PPO | Admitting: Obstetrics and Gynecology

## 2018-09-14 LAB — RPR: RPR Ser Ql: NONREACTIVE

## 2018-09-15 ENCOUNTER — Inpatient Hospital Stay: Admission: RE | Admit: 2018-09-15 | Payer: BLUE CROSS/BLUE SHIELD | Source: Home / Self Care | Admitting: *Deleted

## 2018-09-22 ENCOUNTER — Encounter: Payer: BC Managed Care – PPO | Admitting: Obstetrics and Gynecology

## 2018-09-23 ENCOUNTER — Encounter: Payer: Self-pay | Admitting: Certified Nurse Midwife

## 2018-10-26 ENCOUNTER — Ambulatory Visit (INDEPENDENT_AMBULATORY_CARE_PROVIDER_SITE_OTHER): Payer: BC Managed Care – PPO | Admitting: Obstetrics and Gynecology

## 2018-10-26 ENCOUNTER — Encounter: Payer: Self-pay | Admitting: Obstetrics and Gynecology

## 2018-10-26 ENCOUNTER — Other Ambulatory Visit: Payer: Self-pay

## 2018-10-26 DIAGNOSIS — M533 Sacrococcygeal disorders, not elsewhere classified: Secondary | ICD-10-CM

## 2018-10-26 NOTE — Patient Instructions (Signed)
Kegel Exercises  Kegel exercises can help strengthen your pelvic floor muscles. The pelvic floor is a group of muscles that support your rectum, small intestine, and bladder. In females, pelvic floor muscles also help support the womb (uterus). These muscles help you control the flow of urine and stool. Kegel exercises are painless and simple, and they do not require any equipment. Your provider may suggest Kegel exercises to:  Improve bladder and bowel control.  Improve sexual response.  Improve weak pelvic floor muscles after surgery to remove the uterus (hysterectomy) or pregnancy (females).  Improve weak pelvic floor muscles after prostate gland removal or surgery (males). Kegel exercises involve squeezing your pelvic floor muscles, which are the same muscles you squeeze when you try to stop the flow of urine or keep from passing gas. The exercises can be done while sitting, standing, or lying down, but it is best to vary your position. Exercises How to do Kegel exercises: 1. Squeeze your pelvic floor muscles tight. You should feel a tight lift in your rectal area. If you are a female, you should also feel a tightness in your vaginal area. Keep your stomach, buttocks, and legs relaxed. 2. Hold the muscles tight for up to 10 seconds. 3. Breathe normally. 4. Relax your muscles. 5. Repeat as told by your health care provider. Repeat this exercise daily as told by your health care provider. Continue to do this exercise for at least 4-6 weeks, or for as long as told by your health care provider. You may be referred to a physical therapist who can help you learn more about how to do Kegel exercises. Depending on your condition, your health care provider may recommend:  Varying how long you squeeze your muscles.  Doing several sets of exercises every day.  Doing exercises for several weeks.  Making Kegel exercises a part of your regular exercise routine. This information is not intended  to replace advice given to you by your health care provider. Make sure you discuss any questions you have with your health care provider. Document Released: 01/28/2012 Document Revised: 09/30/2017 Document Reviewed: 09/30/2017 Elsevier Patient Education  2020 [place] is the small bone at the lower end of the backbone (spine). The tailbone can become injured from:  A fall.  Sitting to row or bike for a long time.  Having a baby. This type of injury can be painful. Most tailbone injuries get better on their own in 4-6 weeks. Follow these instructions at home: Activity  Avoid sitting in one place for a long time.  Wear proper pads and gear when riding a bike or rowing.  Increase your activity as the pain allows.  Do exercises as told by your doctor or physical therapist. Managing pain, stiffness, and swelling  To lessen pain: ? Sit on a large, rubber or inflated ring or cushion. ? Lean forward when you sit.  If told, apply ice to the injured area. ? Put ice in a plastic bag. ? Place a towel between your skin and the bag. ? Leave the ice on for 20 minutes, 2-3 times per day. Do this for the first 1-2 days.  If told, put heat on the injured area. Do this as often as told by your doctor. Use the heat source that your doctor recommends, such as a moist heat pack or a heating pad. ? Place a towel between your skin and the heat source. ? Leave the heat on for  20-30 minutes. ? Remove the heat if your skin turns bright red. This is very important if you are unable to feel pain, heat, or cold. You may have a greater risk of getting burned. General instructions  Take over-the-counter and prescription medicines only as told by your doctor.  To prevent or treat trouble pooping (constipation) or pain when pooping, your doctor may suggest that you: ? Drink enough fluid to keep your pee (urine) pale yellow. ? Eat foods that are high in fiber. These  include fresh fruits and vegetables, whole grains, and beans. ? Limit foods that are high in fat and sugar. These include fried and sweet foods. ? Take an over-the-counter or prescription medicine to treat trouble pooping.  Keep all follow-up visits as told by your doctor. This is important. Contact a doctor if:  Your pain gets worse.  Pooping causes you pain.  You cannot poop after 4 days.  You have pain during sex. Summary  A tailbone injury can happen from a fall, from sitting for a long time to row or bike, or after having a baby.  These injuries can be painful. Most tailbone injuries get better on their own in 4-6 weeks.  Sit on a large, rubber or inflated ring or cushion to lessen pain.  Avoid sitting in one place for a long time.  Follow your doctor's suggestions to prevent or treat trouble pooping. This information is not intended to replace advice given to you by your health care provider. Make sure you discuss any questions you have with your health care provider. Document Released: 03/15/2010 Document Revised: 03/10/2017 Document Reviewed: 03/10/2017 Elsevier Patient Education  2020 [employer]

## 2018-10-26 NOTE — Progress Notes (Signed)
   OBSTETRICS POSTPARTUM CLINIC PROGRESS NOTE  Subjective:     Katrina Tate is a 33 y.o. G45P1001 female who presents for a postpartum visit. She is 6 weeks postpartum following a spontaneous vaginal delivery. I have fully reviewed the prenatal and intrapartum course. The delivery was at 33 gestational weeks.  Anesthesia: epidural. Postpartum course has been well. Baby's course has been well. Baby is feeding by breast. Bleeding: patient has not resumed menses, with No LMP recorded.. Bowel function is normal. Bladder function is normal. Patient is not sexually active. Contraception method desired is none. Postpartum depression screening: negative (EDPS =2).  The following portions of the patient's history were reviewed and updated as appropriate: allergies, current medications, past family history, past medical history, past social history, past surgical history and problem list.  Review of Systems A comprehensive review of systems was negative except for: Musculoskeletal: positive for sacral pain and soreness.  She reports that during her delivery she thought she heard and felt a "pop". Since that time it has been difficult to sit for long periods of time as her tailbone begins to hurt.  Has had to shift positions while sitting for the first several weeks.  Notes that it is slowly improving with the use of a sacral support pillow but wonders if anything else can be done.   Objective:    BP 120/85   Pulse 99   Ht 5\' 8"  (1.727 m)   Wt 215 lb 3.2 oz (97.6 kg)   Breastfeeding Yes   BMI 32.72 kg/m   General:  alert and no distress   Breasts:  inspection negative, no nipple discharge or bleeding, no masses or nodularity palpable  Lungs: clear to auscultation bilaterally  Heart:  regular rate and rhythm, S1, S2 normal, no murmur, click, rub or gallop  Abdomen: soft, non-tender; bowel sounds normal; no masses,  no organomegaly.     Vulva:  normal  Vagina: normal vagina, no discharge,  exudate, lesion, or erythema  Cervix:  no cervical motion tenderness and no lesions  Corpus: normal size, contour, position, consistency, mobility, non-tender  Adnexa:  normal adnexa and no mass, fullness, tenderness  Rectal Exam: Not performed.         Labs:  Lab Results  Component Value Date   HGB 12.8 09/11/2018     Assessment:   1. Postpartum examination following vaginal delivery   2. Lactating mother   3. Sacral back pain     Plan:    1. Contraception: none currently. Patient notes that she and her husband are deciding if they want another child soon, or if they are done with childbearing. States he plans to get a vasectomy if they decide to be finished with childbearing.  2. Continue to encourage breasteeding for at least 6 months.  3. Sacral back pain, discussed comfort measures, advised that healing can take months. If still no further improvement after several months, patient notes she would like a referral for physical therapy (offered for sooner evaluation, however patient declines at this time).  3. Follow up in: 9 months for annual exam, or sooner as needed.    Rubie Maid, MD [facility]

## 2018-10-26 NOTE — Progress Notes (Signed)
PT is present today for her postpartum visit. Pt stated that she is breastfeeding and have not had sexually intercourse recently. Pt stated that she would not like to get any form of birth control at time; pt wants to have another baby soon. EPDS=2.  Pt stated that she is doing well no complaints.

## 2018-11-25 ENCOUNTER — Telehealth: Payer: Self-pay | Admitting: Obstetrics and Gynecology

## 2018-11-25 NOTE — Telephone Encounter (Signed)
Pt called and stated that her employer has not received her paperwork upon arriving back to work. Pt is requesting the forms to be re-faxed. Please advise.

## 2018-11-25 NOTE — Telephone Encounter (Addendum)
Spoke with patient and she is going to have her work fax papers back in. She stated that she is to return to work on 12/19/2018. I have given her the fax number to have them faxed over to Dr. Marcelline Mates.

## 2018-12-07 NOTE — Telephone Encounter (Signed)
Pt called no answer LM via VM that I had not received the paperwork via fax from her job yet. Pt was advise to please call the office to speak more about the paperwork.

## 2018-12-07 NOTE — Telephone Encounter (Signed)
Pt replied to [employer] concerning paperwork.

## 2019-07-26 ENCOUNTER — Encounter: Payer: BC Managed Care – PPO | Admitting: Obstetrics and Gynecology

## 2019-07-26 NOTE — Progress Notes (Deleted)
Pt present for annual exam. Pt stated  

## 2019-08-05 ENCOUNTER — Encounter: Payer: Self-pay | Admitting: Obstetrics and Gynecology

## 2020-01-30 ENCOUNTER — Other Ambulatory Visit: Payer: Self-pay

## 2020-01-30 ENCOUNTER — Ambulatory Visit
Admission: EM | Admit: 2020-01-30 | Discharge: 2020-01-30 | Disposition: A | Discharge status: Home / Self Care | Payer: BC Managed Care – PPO | Attending: Family Medicine | Admitting: Family Medicine

## 2020-01-30 ENCOUNTER — Encounter: Payer: Self-pay | Admitting: Family Medicine

## 2020-01-30 DIAGNOSIS — N898 Other specified noninflammatory disorders of vagina: Secondary | ICD-10-CM | POA: Diagnosis present

## 2020-01-30 MED ORDER — FLUCONAZOLE 150 MG PO TABS: 150.0000 mg | ORAL_TABLET | Freq: Every day | ORAL | 0 refills | Status: DC

## 2020-01-30 NOTE — Discharge Instructions (Addendum)
Treating you for a yeast infection.  Take the Diflucan as prescribed.  Recommend discontinuing the Monistat We will call you with any positive results of your swabs.  You can check MyChart for results.

## 2020-01-30 NOTE — ED Provider Notes (Signed)
Renaldo Fiddler    CSN: [id number] Arrival date & time: 01/30/20  1002      History   Chief Complaint Chief Complaint  Patient presents with  . Vaginal Issue    HPI Katrina Tate is a 34 y.o. female.   Patient is a 34 year old female who presents today for vaginal itching, irritation, discharge.  This started approximate 1 week ago.  Mostly irritated to vaginal opening.  Reporting some broken skin.  Took over-the-counter Monistat cream starting yesterday with 3-day.  This did relieve some of her itching.  Reporting husband was recently diagnosed with yeast infection.  No abdominal pain, dysuria, hematuria or urinary frequency.     Past Medical History:  Diagnosis Date  . Shingles 1993    Patient Active Problem List   Diagnosis Date Noted  . Mild obesity 02/22/2018    Past Surgical History:  Procedure Laterality Date  . WISDOM TOOTH EXTRACTION  2009    OB History    Gravida  1   Para  1   Term  1   Preterm      AB      Living  1     SAB      TAB      Ectopic      Multiple  0   Live Births  1            Home Medications    Prior to Admission medications   Medication Sig Start Date End Date Taking? Authorizing Provider  diphenhydrAMINE (BENADRYL) 25 MG tablet Take 25 mg by mouth every 6 (six) hours as needed.   Yes [provider]  fluconazole (DIFLUCAN) 150 MG tablet Take 1 tablet (150 mg total) by mouth daily. 01/30/20   Dahlia Byes A, NP  Prenatal Vit-Fe Fumarate-FA (MULTIVITAMIN-PRENATAL) 27-0.8 MG TABS tablet Take 1 tablet by mouth daily at 12 noon.    [provider]    Family History Family History  Problem Relation Age of Onset  . Thyroid disease Maternal Grandmother   . Healthy Father   . Breast cancer Neg Hx   . Ovarian cancer Neg Hx   . Colon cancer Neg Hx     Social History Social History   Tobacco Use  . Smoking status: Never Smoker  . Smokeless tobacco: Never Used  Vaping Use  .  Vaping Use: Never used  Substance Use Topics  . Alcohol use: Not Currently  . Drug use: Never     Allergies   Pine needles [pine tar] and Tree extract   Review of Systems Review of Systems   Physical Exam Triage Vital Signs ED Triage Vitals  Enc Vitals Group     BP 01/30/20 1048 134/86     Pulse Rate 01/30/20 1048 93     Resp 01/30/20 1048 18     Temp 01/30/20 1048 98.4 F (36.9 C)     Temp Source 01/30/20 1048 Oral     SpO2 01/30/20 1048 98 %     Weight --      Height --      Head Circumference --      Peak Flow --      Pain Score 01/30/20 1042 5     Pain Loc --      Pain Edu? --      Excl. in GC? --    No data found.  Updated Vital Signs BP 134/86 (BP Location: Left Arm)   Pulse 93  Temp 98.4 F (36.9 C) (Oral)   Resp 18   LMP 01/23/2020   SpO2 98%   Visual Acuity Right Eye Distance:   Left Eye Distance:   Bilateral Distance:    Right Eye Near:   Left Eye Near:    Bilateral Near:     Physical Exam Vitals and nursing note reviewed.  Constitutional:      General: She is not in acute distress.    Appearance: Normal appearance. She is not ill-appearing, toxic-appearing or diaphoretic.  HENT:     Head: Normocephalic.     Nose: Nose normal.  Eyes:     Conjunctiva/sclera: Conjunctivae normal.  Pulmonary:     Effort: Pulmonary effort is normal.  Genitourinary:    Comments: Erythema, swelling to external vaginal area with white discharge noted at vaginal opening.  Patient did use Monistat last night. Musculoskeletal:        General: Normal range of motion.     Cervical back: Normal range of motion.  Skin:    General: Skin is warm and dry.     Findings: No rash.  Neurological:     Mental Status: She is alert.  Psychiatric:        Mood and Affect: Mood normal.      UC Treatments / Results  Labs (all labs ordered are listed, but only abnormal results are displayed) Labs Reviewed  HSV CULTURE AND TYPING  CERVICOVAGINAL ANCILLARY ONLY     EKG   Radiology No results found.  Procedures Procedures (including critical care time)  Medications Ordered in UC Medications - No data to display  Initial Impression / Assessment and Plan / UC Course  I have reviewed the triage vital signs and the nursing notes.  Pertinent labs & imaging results that were available during my care of the patient were reviewed by me and considered in my medical decision making (see chart for details).     Vaginal discharge Most likely vaginitis from use. Treating with Diflucan. Swab sent for further testing. Follow up as needed for continued or worsening symptoms  Final Clinical Impressions(s) / UC Diagnoses   Final diagnoses:  Vaginal discharge     Discharge Instructions     Treating you for a yeast infection.  Take the Diflucan as prescribed.  Recommend discontinuing the Monistat We will call you with any positive results of your swabs.  You can check MyChart for results.    ED Prescriptions    Medication Sig Dispense Auth. Provider   fluconazole (DIFLUCAN) 150 MG tablet Take 1 tablet (150 mg total) by mouth daily. 2 tablet Dahlia Byes A, NP     PDMP not reviewed this encounter.   Janace Aris, NP 01/30/20 1114

## 2020-01-30 NOTE — ED Triage Notes (Signed)
Patient c/o vaginal pain x 1 week.   Patient stated onset of symptoms began w/ vaginal itching and patent began itching last week and patient states "I believe I have a cut at the entrance of my vagina."  Patient took a OTC monistat cream w/ relief of itching. Patient reports that husband was diagnosed with a yeast infection.   Patient denies dysuria.

## 2020-01-31 LAB — CERVICOVAGINAL ANCILLARY ONLY
Bacterial Vaginitis (gardnerella): NEGATIVE
Candida Glabrata: NEGATIVE
Candida Vaginitis: POSITIVE — AB
Chlamydia: NEGATIVE
Comment: NEGATIVE
Comment: NEGATIVE
Comment: NEGATIVE
Comment: NEGATIVE
Comment: NEGATIVE
Comment: NORMAL
Neisseria Gonorrhea: NEGATIVE
Trichomonas: NEGATIVE

## 2020-02-03 LAB — HSV CULTURE AND TYPING

## 2020-08-22 IMAGING — US OBSTETRIC 14+ WK ULTRASOUND
1 series · 13 of 28 positions shown · non-contrast
Comparison: none

CLINICAL DATA: Current assigned gestational age of 20 weeks 6 days.
Evaluate fetal anatomy and growth.

EXAM:
OBSTETRICAL ULTRASOUND >14 WKS

[Series 1: obstetric 14+ wk ultrasound · 13 of 97 slices shown]
[im 4/97]
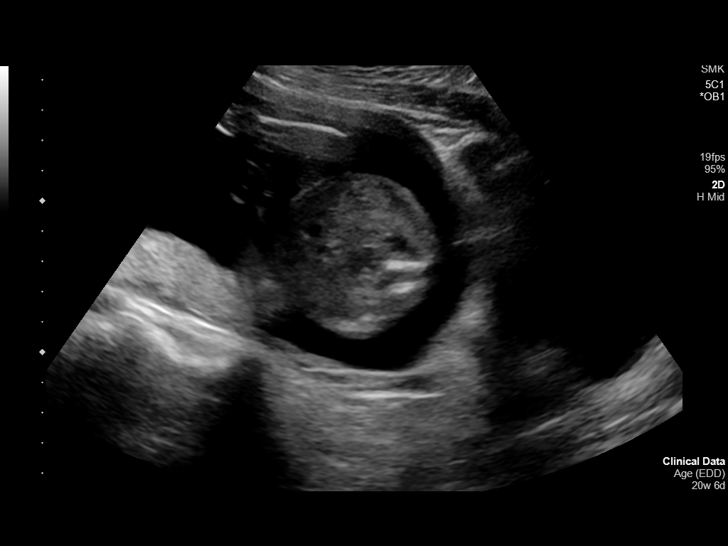
[im 11/97]
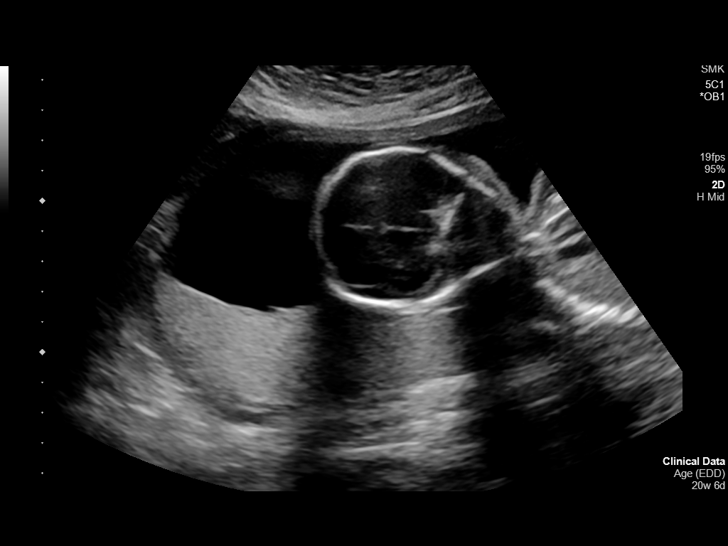
[im 18/97]
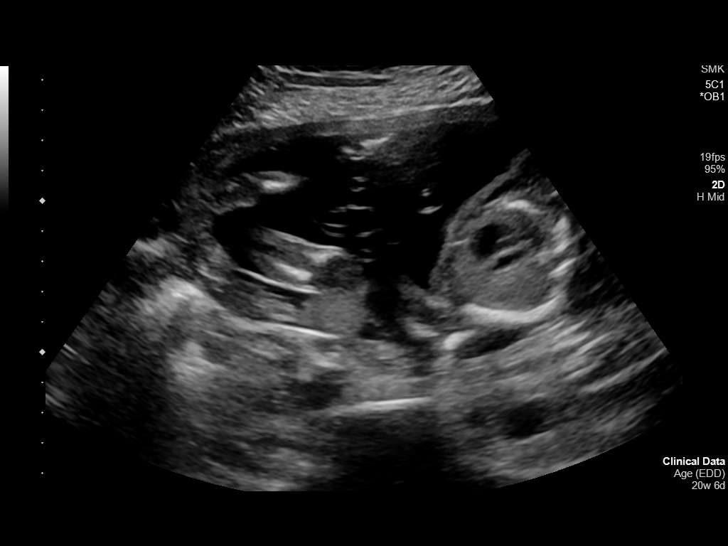
[im 25/97]
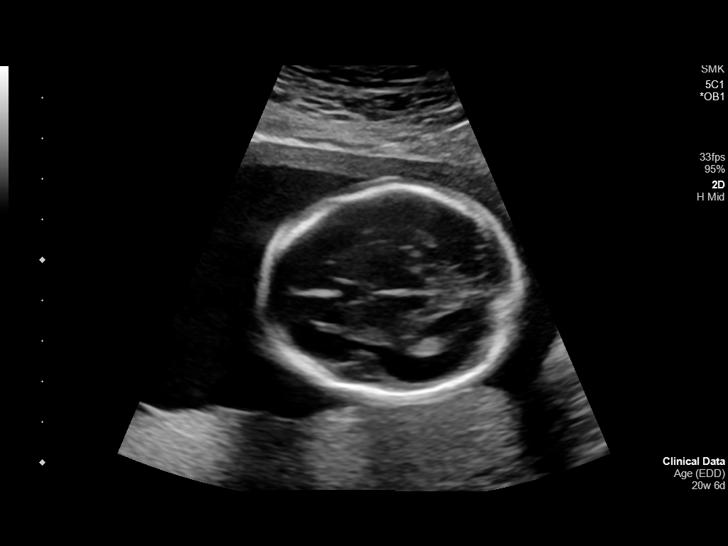
[im 33/97]
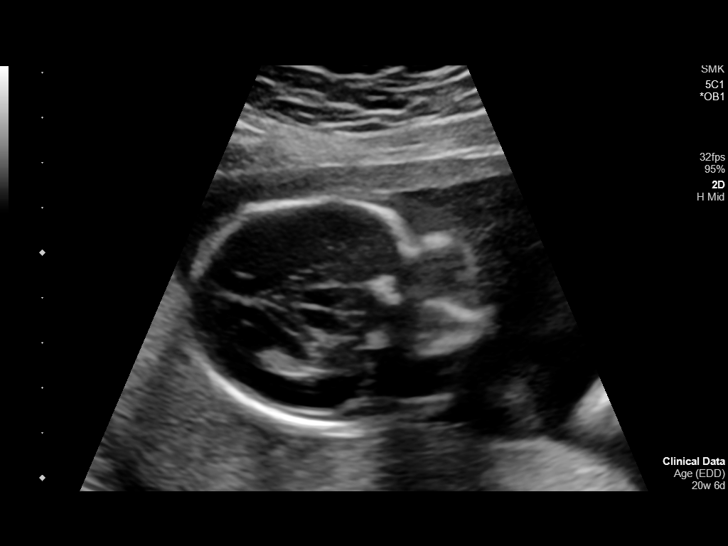
[im 40/97]
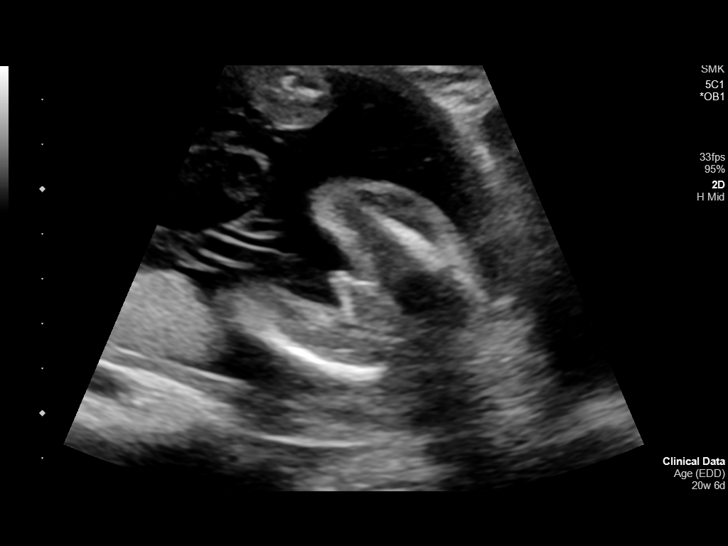
[im 50/97]
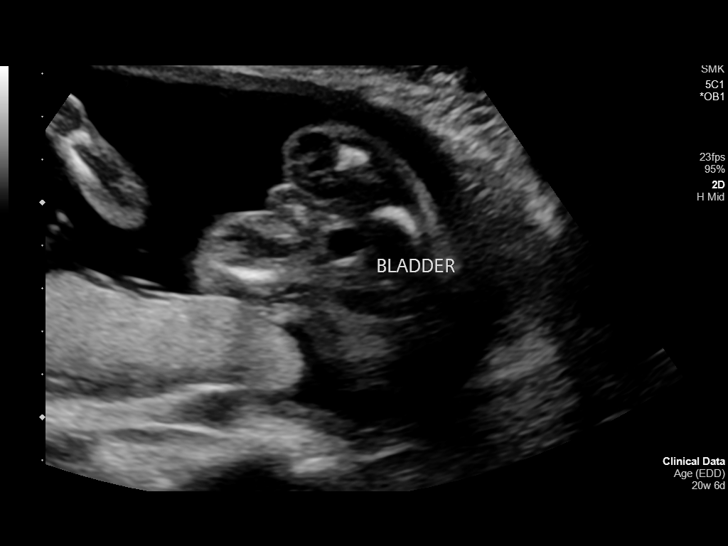
[im 57/97]
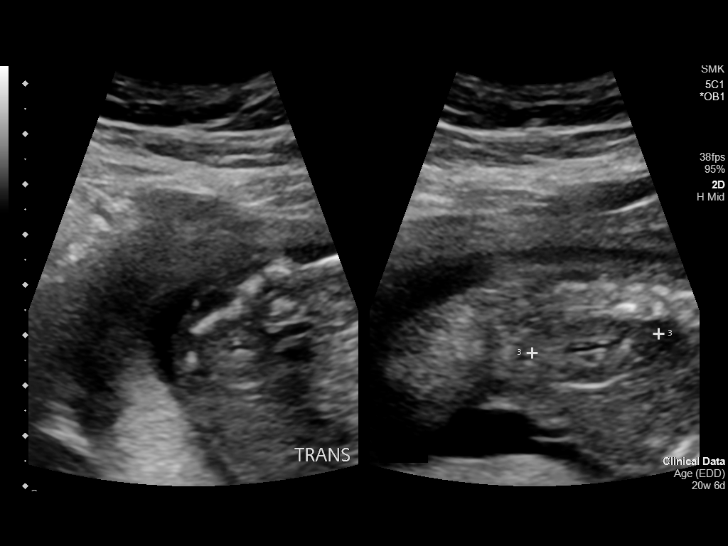
[im 65/97]
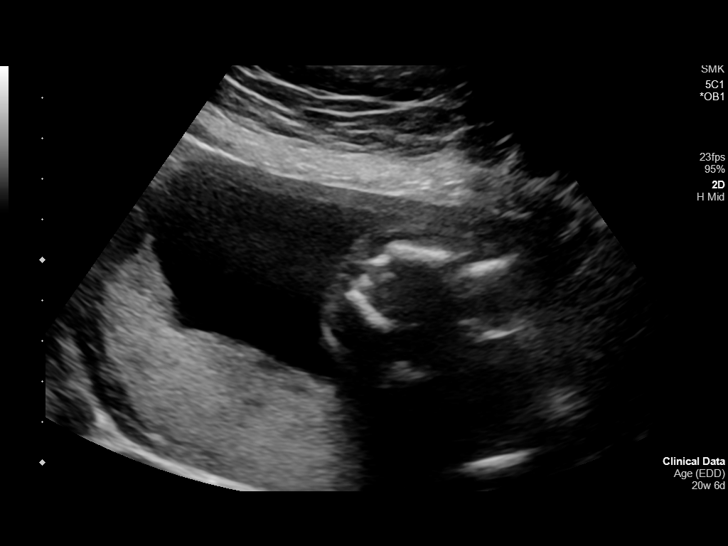
[im 72/97]
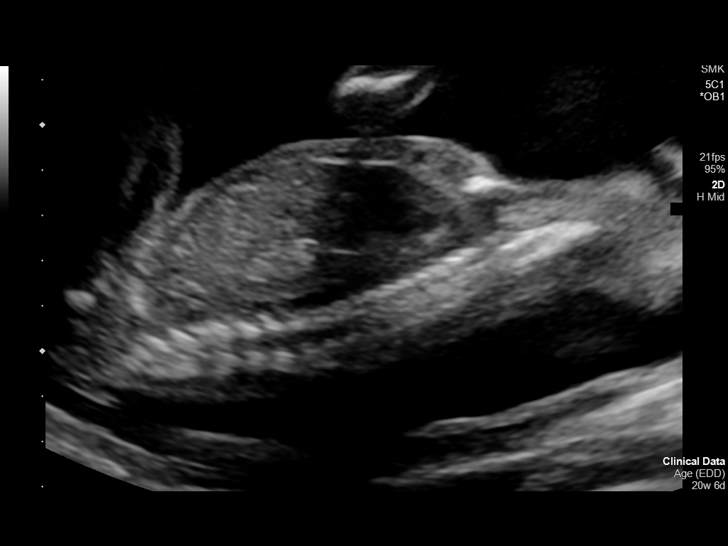
[im 79/97]
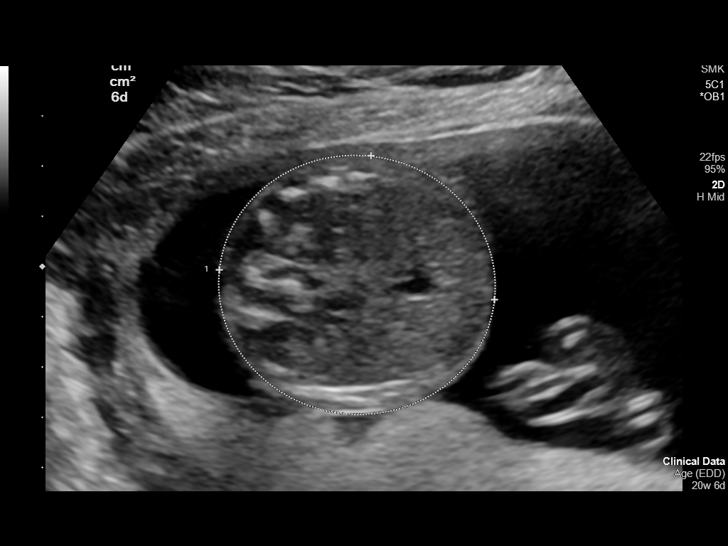
[im 86/97]
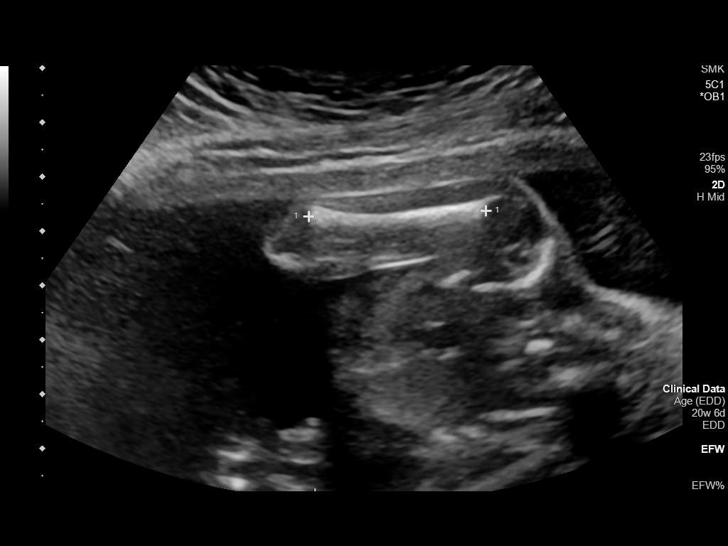
[im 93/97]
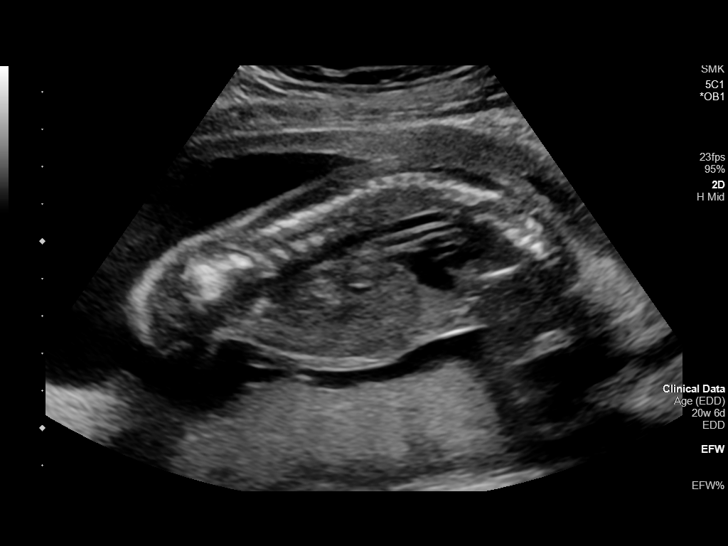

[13 of 28 positions shown; findings below may reference images not displayed]

FINDINGS: Number of Fetuses: 1

Heart Rate:  153 bpm

Movement: Yes

Presentation: Variable

Previa: No

Placental Location: Posterior

Amniotic Fluid (Subjective): Within normal limits

Amniotic Fluid (Objective):

Vertical pocket = 6.2cm

FETAL BIOMETRY

BPD: 5.2cm 21w 5d

HC:   19.0cm 21w 2d

AC:   16.7cm 21w 6d

FL:   3.5cm 20w 6d

Current Mean GA: 21w 0d US EDC: 09/14/2018

Assigned GA:  20w 6d Assigned EDC: 09/15/2018

FETAL ANATOMY

Lateral Ventricles: Appears normal

Thalami/CSP: Appears normal

Posterior Fossa:  Appears normal

Nuchal Region: Appears normal   NFT= N/A > 20 WKS

Upper Lip: Appears normal

Spine: Appears normal

4 Chamber Heart on Left: Appears normal

LVOT: Appears normal

RVOT: Appears normal

Stomach on Left: Appears normal

3 Vessel Cord: Appears normal

Cord Insertion site: Appears normal

Kidneys: Both visualized; mild left renal pyelectasis measuring 5 mm

Bladder: Appears normal

Extremities: Appears normal

Sex: Male

Maternal Findings:

Cervix:  3.5 cm TA
IMPRESSION: Current assigned gestational age of twenty weeks 6 days. Appropriate
fetal growth.

No fetal anomalies identified. Mild left fetal renal pyelectasis
measuring 5 mm. Recommend continued follow-up by ultrasound in 6-8
weeks.

Normal amniotic fluid volume.  Normal cervical length.

## 2020-09-19 ENCOUNTER — Encounter: Payer: Self-pay | Admitting: Emergency Medicine

## 2020-09-19 ENCOUNTER — Ambulatory Visit
Admission: EM | Admit: 2020-09-19 | Discharge: 2020-09-19 | Disposition: A | Discharge status: Home / Self Care | Payer: BC Managed Care – PPO | Attending: Emergency Medicine | Admitting: Emergency Medicine

## 2020-09-19 ENCOUNTER — Other Ambulatory Visit: Payer: Self-pay

## 2020-09-19 DIAGNOSIS — B349 Viral infection, unspecified: Secondary | ICD-10-CM

## 2020-09-19 DIAGNOSIS — B309 Viral conjunctivitis, unspecified: Secondary | ICD-10-CM | POA: Diagnosis not present

## 2020-09-19 LAB — POCT RAPID STREP A (OFFICE): Rapid Strep A Screen: NEGATIVE

## 2020-09-19 MED ORDER — LIDOCAINE VISCOUS HCL 2 % MT SOLN: 15.0000 mL | OROMUCOSAL | 0 refills | Status: DC | PRN

## 2020-09-19 MED ORDER — KETOTIFEN FUMARATE 0.025 % OP SOLN: 1.0000 [drp] | Freq: Two times a day (BID) | OPHTHALMIC | 0 refills | Status: DC

## 2020-09-19 NOTE — ED Triage Notes (Signed)
Patient c/o sore throat x 4 days.   Patient c/o eye watering and itchiness x 2 days.   Patient endorses a temperature of 99 F at home.   Patient endorses worsening symptoms at night, " it feels like sand is in my eye".   Patient endorses hoarse voice and painful swallowing.   Patient endorses eye "crusting and redness".   Patient took an at home COVID test with negative results.   Patient has taken Mucinex with some relief of runny nose per patient report.   Patient has used OTC clear eyes with some relief of symptoms.

## 2020-09-19 NOTE — Discharge Instructions (Addendum)

## 2020-09-19 NOTE — ED Provider Notes (Signed)
CHIEF COMPLAINT:   Chief Complaint  Patient presents with   Sore Throat   Eye Problem     SUBJECTIVE/HPI:  HPI A very pleasant 35 y.o.Female presents today with sore throat, drainage to eyes and itching to her eyes.  Patient states that her sore throat began about 4 days ago to include a hoarse voice and states that it is painful to swallow and it hurts to talk.  Patient also reports that over the last 2 days she has developed draining to her eyes which she has noticed a cloudy film over in the mornings.  Patient does report some eye crusting to the left eye with redness.  She has used over-the-counter Clear Eyes drops which have helped some.  Patient is also use Mucinex which has helped.  Patient states that her son recently had a birthday party a week ago and 1 child had a cold. Patient does not report any shortness of breath, chest pain, palpitations, visual changes, weakness, tingling, headache, nausea, vomiting, diarrhea, fever, chills.   has a past medical history of Shingles (1993).  ROS:  Review of Systems See Subjective/HPI Medications, Allergies and Problem List personally reviewed in Epic today OBJECTIVE:   Vitals:   09/19/20 1428  BP: 121/83  Pulse: 98  Resp: 17  Temp: 98.8 F (37.1 C)  SpO2: 97%    Physical Exam   General: Appears well-developed and well-nourished. No acute distress.  HEENT Head: Normocephalic and atraumatic. Ears: Hearing grossly intact, no drainage or visible deformity.  Nose: No nasal deviation. Mouth/Throat: No stridor or tracheal deviation.  Mildly erythematous posterior pharynx noted with clear drainage present.  No white patchy exudate noted. Eyes: Conjunctivae and EOM are normal. No eye drainage or scleral icterus bilaterally.  Neck: Normal range of motion, neck is supple. No cervical, tonsillar or submandibular lymph nodes palpated.   Cardiovascular: Normal rate. Regular rhythm; no murmurs, gallops, or rubs.  Pulm/Chest: No respiratory  distress. Breath sounds normal bilaterally without wheezes, rhonchi, or rales.  Neurological: Alert and oriented to person, place, and time.  Skin: Skin is warm and dry.  No rashes, lesions, abrasions or bruising noted to skin.   Psychiatric: Normal mood, affect, behavior, and thought content.   Vital signs and nursing note reviewed.   Patient stable and cooperative with examination. PROCEDURES:    LABS/X-RAYS/EKG/MEDS:   @MEDADMIN @ Results for orders placed or performed during the hospital encounter of 09/19/20  POCT rapid strep A  Result Value Ref Range   Rapid Strep A Screen Negative Negative   -Strep negative MEDICAL DECISION MAKING:   Patient presents with sore throat, drainage to eyes and itching to her eyes.  Patient states that her sore throat began about 4 days ago to include a hoarse voice and states that it is painful to swallow and it hurts to talk.  Patient also reports that over the last 2 days she has developed draining to her eyes which she has noticed a cloudy film over in the mornings.  Patient does report some eye crusting to the left eye with redness.  She has used over-the-counter Clear Eyes drops which have helped some.  Patient is also use Mucinex which has helped.  Patient states that her son recently had a birthday party a week ago and 1 child had a cold. Patient does not report any shortness of breath, chest pain, palpitations, visual changes, weakness, tingling, headache, nausea, vomiting, diarrhea, fever, chills.  Chart review completed.  Strep negative.  Given symptoms  along with assessment findings, likely viral illness and viral left conjunctivitis.  Rx'd viscous lidocaine and Zaditor eyedrops to the patient's preferred pharmacy.  Did run a COVID PCR test to rule out this illness.  No known COVID-19 contacts.  Advised about home treatment and care along with strict return precautions for worsening of symptoms as outlined in her AVS.  Return as needed.  Patient  verbalized understanding and agreed with treatment plan.  Patient stable upon discharge. ASSESSMENT/PLAN:  1. Viral illness - Novel Coronavirus, NAA ([facility]); Standing - Novel Coronavirus, NAA ([facility])  2. Viral conjunctivitis of left eye  Meds ordered this encounter  Medications   lidocaine (XYLOCAINE) 2 % solution    Sig: Use as directed 15 mLs in the mouth or throat every 4 (four) hours as needed for mouth pain.    Dispense:  100 mL    Refill:  0    Order Specific Question:   Supervising Provider    Answer:   Merrilee Jansky [[id number]]   ketotifen (ZADITOR) 0.025 % ophthalmic solution    Sig: Place 1 drop into the left eye 2 (two) times daily.    Dispense:  5 mL    Refill:  0    Order Specific Question:   Supervising Provider    Answer:   Merrilee Jansky [id number]    Instructions about new medications and side effects provided.  Plan:   Discharge Instructions      We will call you with any positive results from your COVID-19 testing completed in clinic today.  If you do not receive a phone call from [place] within the next 2-3 days, check your MyChart for up-to-date health information related to testing completed in clinic today.   For most people this is a self-limiting process and can take anywhere from 7 - 10 days to start feeling better. A cough can last up to 3 weeks. Pay special attention to handwashing as this can help prevent the spread of the virus.   Always read the labels of cough and cold medications as they may contain some of the ingredients below.  Rest, push lots of fluids (especially water), and utilize supportive care for symptoms. You may take acetaminophen (Tylenol) every 4-6 hours and ibuprofen every 6-8 hours for muscle pain, joint pain, headaches (you may also alternate these medications). Mucinex (guaifenesin) may be taken over the counter for cough as needed can loosen phlegm. Please read the instructions and take as directed.  Sudafed  (pseudophedrine) is sold behind the counter and can help reduce nasal pressure; avoid taking this if you have high blood pressure or feel jittery. Sudafed PE (phenylephrine) can be a helpful, short-term, over-the-counter alternative to limit side effects or if you have high blood pressure.  Flonase nasal spray can help alleviate congestion and sinus pressure. Many patients choose Afrin as a nasal decongestant; do not use for more than 3 days for risk of rebound (increased symptoms after stopping medication).  Saline nasal sprays or rinses can also help nasal congestion (use bottled or sterile water). Warm tea with lemon and honey can sooth sore throat and cough, as can cough drops.   Return to clinic for high fever not improving with medications, chest pain, difficulty breathing, non-stop vomiting, or coughing blood. Follow-up with your primary care provider if symptoms do not improve as expected in the next 5-7 days.        A copy of these instructions have been given to the patient or responsible  adult who demonstrated the ability to learn, asked appropriate questions, and verbalized understanding of the plan of care.  There were no barriers to learning identified.    Amalia Greenhouse, FNP-C 09/19/20  This note was partially made with the aid of speech-to-text dictation; typographical errors are not intentional.    Amalia Greenhouse, FNP 09/19/20 1459

## 2020-09-20 LAB — SARS-COV-2, NAA 2 DAY TAT

## 2020-09-20 LAB — NOVEL CORONAVIRUS, NAA: SARS-CoV-2, NAA: NOT DETECTED

## 2022-02-24 NOTE — L&D Delivery Note (Signed)
OB/GYN Faculty Practice Delivery Note  Katrina Tate is a 38 y.o. G2P1001 s/p SVD at [redacted]w[redacted]d. She was admitted for SOL.   ROM: rupture date, rupture time, delivery date, or delivery time have not been documented with clear fluid GBS Status:  Negative/-- (08/21 1621) Maximum Maternal Temperature: 97.51F  Labor Progress: Initial SVE: 10cm dilated. She then progressed to complete.   Delivery Date/Time: [phone] Delivery: Called to room and patient was complete and pushing. Head delivered LOA. No nuchal cord present. Shoulder and body delivered in usual fashion. Infant with spontaneous cry, placed on mother's abdomen, dried and stimulated. Cord clamped x 2 after 1-minute delay, and cut by FOB. Cord blood drawn. Placenta delivered spontaneously with gentle cord traction. Fundus firm with massage and Pitocin. Labia, perineum, vagina, and cervix inspected with 2nd degree perineal laceration repaired in usual running fashion.  Baby Weight: pending  Placenta: 3 vessel, intact. Sent to L&D Complications: None Lacerations: as above EBL: 230 mL Analgesia: none  Infant:  APGAR (1 MIN): 9  APGAR (5 MINS): 9   Hessie Dibble, MD [facility] Family Medicine Fellow, [employer] for [facility], [facility] 10/25/2022, 5:28 AM

## 2022-04-22 ENCOUNTER — Telehealth: Payer: Self-pay | Admitting: *Deleted

## 2022-04-22 NOTE — Telephone Encounter (Signed)
Attempted to call patient to schedule OB interview, left message for pt to call back or respond to the MyChart message I sent.

## 2022-04-30 ENCOUNTER — Ambulatory Visit (INDEPENDENT_AMBULATORY_CARE_PROVIDER_SITE_OTHER): Payer: BLUE CROSS/BLUE SHIELD

## 2022-04-30 ENCOUNTER — Ambulatory Visit (INDEPENDENT_AMBULATORY_CARE_PROVIDER_SITE_OTHER): Payer: BLUE CROSS/BLUE SHIELD | Admitting: *Deleted

## 2022-04-30 VITALS — BP 129/83 | HR 106 | Wt 183.0 lb

## 2022-04-30 DIAGNOSIS — Z3483 Encounter for supervision of other normal pregnancy, third trimester: Secondary | ICD-10-CM | POA: Diagnosis not present

## 2022-04-30 DIAGNOSIS — Z3493 Encounter for supervision of normal pregnancy, unspecified, third trimester: Secondary | ICD-10-CM | POA: Insufficient documentation

## 2022-04-30 DIAGNOSIS — O3680X0 Pregnancy with inconclusive fetal viability, not applicable or unspecified: Secondary | ICD-10-CM

## 2022-04-30 DIAGNOSIS — O0990 Supervision of high risk pregnancy, unspecified, unspecified trimester: Secondary | ICD-10-CM | POA: Insufficient documentation

## 2022-04-30 DIAGNOSIS — Z3482 Encounter for supervision of other normal pregnancy, second trimester: Secondary | ICD-10-CM

## 2022-04-30 DIAGNOSIS — Z3A38 38 weeks gestation of pregnancy: Secondary | ICD-10-CM | POA: Diagnosis not present

## 2022-04-30 DIAGNOSIS — Z3A13 13 weeks gestation of pregnancy: Secondary | ICD-10-CM

## 2022-04-30 DIAGNOSIS — O09522 Supervision of elderly multigravida, second trimester: Secondary | ICD-10-CM | POA: Insufficient documentation

## 2022-04-30 DIAGNOSIS — O3663X0 Maternal care for excessive fetal growth, third trimester, not applicable or unspecified: Secondary | ICD-10-CM | POA: Diagnosis not present

## 2022-04-30 DIAGNOSIS — O09523 Supervision of elderly multigravida, third trimester: Secondary | ICD-10-CM | POA: Diagnosis not present

## 2022-04-30 DIAGNOSIS — Z1339 Encounter for screening examination for other mental health and behavioral disorders: Secondary | ICD-10-CM | POA: Diagnosis not present

## 2022-04-30 NOTE — Progress Notes (Signed)
New OB Intake  I explained I am completing New OB Intake today. We discussed her potential EDD of 11/04/22 that is based on LMP. LMP of 01/28/22 (estimated). Pt is G2/P1. I reviewed her allergies, medications, Medical/Surgical/OB history, and appropriate screenings.   Patient Active Problem List   Diagnosis Date Noted   Supervision of high risk pregnancy, antepartum 04/30/2022   Advanced maternal age in multigravida, second trimester 04/30/2022    Concerns addressed today  Delivery Plans:  Plans to deliver at [facility].   MyChart/Babyscripts MyChart access verified. I explained pt will have some visits in office and some virtually. Babyscripts app discussed and ordered.   Blood Pressure Cuff  BP cuff  needs to be given at [facility]  Discussed to be used for virtual visits and or if needed BP checks weekly.    Anatomy US Explained first scheduled [place] will be around 19 weeks.   Labs Discussed Johnsie Cancel genetic screening with patient. Will decide prior to New OB visit.  Routine prenatal labs needed.   Placed OB Box on problem list and updated   Patient informed that the ultrasound is considered a limited obstetric ultrasound and is not intended to be a complete ultrasound exam.  Patient also informed that the ultrasound is not being completed with the intent of assessing for fetal or placental anomalies or any pelvic abnormalities. Explained that the purpose of today's ultrasound is to assess for dating and fetal heart rate.  Patient acknowledges the purpose of the exam and the limitations of the study.      First visit review I reviewed new OB appt with pt. I explained she will have a pelvic exam, ob bloodwork with possible genetic screening, and PAP smear. Explained pt will be seen by Dr Kennon Rounds at first visit.    Crosby Oyster, RN 04/30/2022  5:02 PM

## 2022-05-08 ENCOUNTER — Encounter: Payer: Self-pay | Admitting: Family Medicine

## 2022-05-08 ENCOUNTER — Other Ambulatory Visit (HOSPITAL_COMMUNITY)
Admission: RE | Admit: 2022-05-08 | Discharge: 2022-05-08 | Disposition: A | Discharge status: Home / Self Care | Payer: BLUE CROSS/BLUE SHIELD | Source: Ambulatory Visit | Attending: Family Medicine | Admitting: Family Medicine

## 2022-05-08 ENCOUNTER — Ambulatory Visit (INDEPENDENT_AMBULATORY_CARE_PROVIDER_SITE_OTHER): Payer: BLUE CROSS/BLUE SHIELD | Admitting: Family Medicine

## 2022-05-08 VITALS — BP 111/81 | HR 96 | Wt 182.0 lb

## 2022-05-08 DIAGNOSIS — O0990 Supervision of high risk pregnancy, unspecified, unspecified trimester: Secondary | ICD-10-CM

## 2022-05-08 DIAGNOSIS — O09522 Supervision of elderly multigravida, second trimester: Secondary | ICD-10-CM

## 2022-05-08 DIAGNOSIS — O09892 Supervision of other high risk pregnancies, second trimester: Secondary | ICD-10-CM

## 2022-05-08 DIAGNOSIS — Z2839 Other underimmunization status: Secondary | ICD-10-CM

## 2022-05-08 DIAGNOSIS — Z3A14 14 weeks gestation of pregnancy: Secondary | ICD-10-CM

## 2022-05-08 NOTE — Progress Notes (Signed)
Subjective:   Katrina Tate is a 37 y.o. G2P1001 at 7w2dby LMP, midtrimester ultrasound being seen today for her first obstetrical visit.  Her obstetrical history is significant for advanced maternal age. Patient does intend to breast feed. Pregnancy history fully reviewed.  Patient reports no complaints.  HISTORY: OB History  Gravida Para Term Preterm AB Living  '2 1 1 '$ 0 0 1  SAB IAB Ectopic Multiple Live Births  0 0 0 0 1    # Outcome Date GA Lbr Len/2nd Weight Sex Delivery Anes PTL Lv  2 Current           1 Term 09/11/18 358w3d 03:27 8 lb 12 oz (3.97 kg) M Vag-Spont EPI, Local  LIV     Name: Cheever,BOY Sinahi     Apgar1: 8  Apgar5: 9   Last pap smear was  2019 and was normal Past Medical History:  Diagnosis Date   Shingles 1993   Past Surgical History:  Procedure Laterality Date   WISDOM TOOTH EXTRACTION  2009   Family History  Problem Relation Age of Onset   Hypertension Mother    Healthy Father    Thyroid disease Maternal Grandmother    Heart disease Maternal Grandfather    Breast cancer Neg Hx    Ovarian cancer Neg Hx    Colon cancer Neg Hx    Social History   Tobacco Use   Smoking status: Never   Smokeless tobacco: Never  Vaping Use   Vaping Use: Never used  Substance Use Topics   Alcohol use: Not Currently   Drug use: Never   Allergies  Allergen Reactions   Pine Needles [Pine Tar]    Tree Extract    Current Outpatient Medications on File Prior to Visit  Medication Sig Dispense Refill   Prenatal Vit-Fe Fumarate-FA (MULTIVITAMIN-PRENATAL) 27-0.8 MG TABS tablet Take 1 tablet by mouth daily at 12 noon.     No current facility-administered medications on file prior to visit.     Exam   Vitals:   05/08/22 1500  BP: 111/81  Pulse: 96  Weight: 182 lb (82.6 kg)   Fetal Heart Rate (bpm): 152  System: General: well-developed, well-nourished female in no acute distress   Skin: normal coloration and turgor, no rashes   Neurologic:  oriented, normal, negative, normal mood   Extremities: normal strength, tone, and muscle mass, ROM of all joints is normal   HEENT PERRLA, extraocular movement intact and sclera clear, anicteric   Mouth/Teeth mucous membranes moist, pharynx normal without lesions and dental hygiene good   Neck supple and no masses   Cardiovascular: regular rate and rhythm   Respiratory:  no respiratory distress, normal breath sounds   Abdomen: soft, non-tender; bowel sounds normal; no masses,  no organomegaly     Assessment:   Pregnancy: G2P1001 Patient Active Problem List   Diagnosis Date Noted   Supervision of high risk pregnancy, antepartum 04/30/2022   Advanced maternal age in multigravida, second trimester 04/30/2022     Plan:  1. Supervision of high risk pregnancy, antepartum Needs detailed anatomy - CBC/D/Plt+RPR+Rh+ABO+RubIgG... - Culture, OB Urine - US[place]FM OB DETAIL +14 WK; Future - Cervicovaginal ancillary only  2. Advanced maternal age in multigravida, second trimester Declined NIPT at this time--may revisit   Initial labs drawn. Continue prenatal vitamins. Genetic Screening discussed, NIPS: declined. Ultrasound discussed; fetal anatomic survey: ordered. Problem list reviewed and updated. The [facility] with multiple MDs and other Advanced Practice Providers was explained to patient; also emphasized that residents, students are part of our team. Routine obstetric precautions reviewed. Return in 4 weeks (on 06/05/2022).

## 2022-05-09 LAB — CBC/D/PLT+RPR+RH+ABO+RUBIGG...
Antibody Screen: NEGATIVE
Basophils Absolute: 0 10*3/uL (ref 0.0–0.2)
Basos: 0 %
EOS (ABSOLUTE): 0 10*3/uL (ref 0.0–0.4)
Eos: 0 %
HCV Ab: NONREACTIVE
HIV Screen 4th Generation wRfx: NONREACTIVE
Hematocrit: 40.3 % (ref 34.0–46.6)
Hemoglobin: 14.2 g/dL (ref 11.1–15.9)
Hepatitis B Surface Ag: NEGATIVE
Immature Grans (Abs): 0 10*3/uL (ref 0.0–0.1)
Immature Granulocytes: 0 %
Lymphocytes Absolute: 1.2 10*3/uL (ref 0.7–3.1)
Lymphs: 18 %
MCH: 31.3 pg (ref 26.6–33.0)
MCHC: 35.2 g/dL (ref 31.5–35.7)
MCV: 89 fL (ref 79–97)
Monocytes Absolute: 0.4 10*3/uL (ref 0.1–0.9)
Monocytes: 6 %
Neutrophils Absolute: 5.3 10*3/uL (ref 1.4–7.0)
Neutrophils: 76 %
Platelets: 282 10*3/uL (ref 150–450)
RBC: 4.53 x10E6/uL (ref 3.77–5.28)
RDW: 13 % (ref 11.7–15.4)
RPR Ser Ql: NONREACTIVE
Rh Factor: POSITIVE
Rubella Antibodies, IGG: <0.9 index — ABNORMAL LOW (ref >0.99)
WBC: 6.9 10*3/uL (ref 3.4–10.8)

## 2022-05-09 LAB — HCV INTERPRETATION

## 2022-05-10 LAB — CULTURE, OB URINE

## 2022-05-10 LAB — URINE CULTURE, OB REFLEX: Organism ID, Bacteria: NO GROWTH

## 2022-05-12 LAB — CERVICOVAGINAL ANCILLARY ONLY
Chlamydia: NEGATIVE
Comment: NEGATIVE
Comment: NORMAL
Neisseria Gonorrhea: NEGATIVE

## 2022-05-20 ENCOUNTER — Telehealth: Payer: Self-pay

## 2022-05-20 NOTE — Telephone Encounter (Signed)
Left message for pt to call office back regarding appt on 4/9

## 2022-06-03 ENCOUNTER — Encounter: Payer: BLUE CROSS/BLUE SHIELD | Admitting: Obstetrics & Gynecology

## 2022-06-03 ENCOUNTER — Telehealth: Payer: Self-pay

## 2022-06-03 NOTE — Telephone Encounter (Signed)
Left message for pt to call office back regarding appt today

## 2022-07-03 ENCOUNTER — Ambulatory Visit: Payer: BLUE CROSS/BLUE SHIELD

## 2022-08-11 ENCOUNTER — Ambulatory Visit: Payer: BLUE CROSS/BLUE SHIELD | Admitting: *Deleted

## 2022-08-11 ENCOUNTER — Encounter: Payer: Self-pay | Admitting: *Deleted

## 2022-08-11 ENCOUNTER — Other Ambulatory Visit: Payer: Self-pay | Admitting: *Deleted

## 2022-08-11 ENCOUNTER — Ambulatory Visit: Payer: BLUE CROSS/BLUE SHIELD | Attending: Family Medicine

## 2022-08-11 VITALS — BP 135/83 | HR 101

## 2022-08-11 DIAGNOSIS — O09899 Supervision of other high risk pregnancies, unspecified trimester: Secondary | ICD-10-CM | POA: Insufficient documentation

## 2022-08-11 DIAGNOSIS — O09522 Supervision of elderly multigravida, second trimester: Secondary | ICD-10-CM

## 2022-08-11 DIAGNOSIS — O0990 Supervision of high risk pregnancy, unspecified, unspecified trimester: Secondary | ICD-10-CM | POA: Insufficient documentation

## 2022-08-11 DIAGNOSIS — Z2839 Other underimmunization status: Secondary | ICD-10-CM | POA: Insufficient documentation

## 2022-08-11 DIAGNOSIS — Z3A27 27 weeks gestation of pregnancy: Secondary | ICD-10-CM | POA: Diagnosis not present

## 2022-08-11 DIAGNOSIS — O09523 Supervision of elderly multigravida, third trimester: Secondary | ICD-10-CM

## 2022-08-14 ENCOUNTER — Ambulatory Visit (INDEPENDENT_AMBULATORY_CARE_PROVIDER_SITE_OTHER): Payer: BLUE CROSS/BLUE SHIELD | Admitting: Obstetrics & Gynecology

## 2022-08-14 ENCOUNTER — Other Ambulatory Visit: Payer: BLUE CROSS/BLUE SHIELD

## 2022-08-14 VITALS — BP 117/75 | HR 91 | Wt 189.0 lb

## 2022-08-14 DIAGNOSIS — Z1339 Encounter for screening examination for other mental health and behavioral disorders: Secondary | ICD-10-CM

## 2022-08-14 DIAGNOSIS — Z3A28 28 weeks gestation of pregnancy: Secondary | ICD-10-CM

## 2022-08-14 DIAGNOSIS — O09523 Supervision of elderly multigravida, third trimester: Secondary | ICD-10-CM

## 2022-08-14 DIAGNOSIS — O0990 Supervision of high risk pregnancy, unspecified, unspecified trimester: Secondary | ICD-10-CM

## 2022-08-14 DIAGNOSIS — O24419 Gestational diabetes mellitus in pregnancy, unspecified control: Secondary | ICD-10-CM

## 2022-08-14 NOTE — Progress Notes (Signed)
PRENATAL VISIT NOTE  Subjective:  Katrina Tate is a 37 y.o. G2P1001 at [redacted]w[redacted]d being seen today for ongoing prenatal care.  She is currently monitored for the following issues for this high-risk pregnancy and has Rubella non-immune status, antepartum; Supervision of high risk pregnancy, antepartum; and Advanced maternal age in multigravida, third trimester on their problem list.  Patient reports no complaints.  Contractions: Not present. Vag. Bleeding: None.  Movement: Present. Denies leaking of fluid.   The following portions of the patient's history were reviewed and updated as appropriate: allergies, current medications, past family history, past medical history, past social history, past surgical history and problem list.   Objective:   Vitals:   08/14/22 0958  BP: 117/75  Pulse: 91  Weight: 189 lb (85.7 kg)    Fetal Status: Fetal Heart Rate (bpm): 133 Fundal Height: 28 cm Movement: Present     General:  Alert, oriented and cooperative. Patient is in no acute distress.  Skin: Skin is warm and dry. No rash noted.   Cardiovascular: Normal heart rate noted  Respiratory: Normal respiratory effort, no problems with respiration noted  Abdomen: Soft, gravid, appropriate for gestational age.  Pain/Pressure: Absent     Pelvic: Cervical exam deferred        Extremities: Normal range of motion.  Edema: None  Mental Status: Normal mood and affect. Normal behavior. Normal judgment and thought content.   [place] MFM OB DETAIL +14 WK  Result Date: 08/11/2022 ----------------------------------------------------------------------  OBSTETRICS REPORT                       (Signed Final 08/11/2022 03:32 pm) ---------------------------------------------------------------------- Patient Info  ID #:       [id number]                          D.O.B.:  [birth year] (37 yrs)  Name:       Katrina Tate           Visit Date: 08/11/2022 01:39 pm  ---------------------------------------------------------------------- Performed By  Attending:        Noralee Space MD        Ref. Address:     [address]                                                             [place], [place]                                                             [address]  Performed By:     Anabel Halon          [facility]  Referred By:      Reva Bores                    MD ---------------------------------------------------------------------- Orders  #  Description                           Code        Ordered By  1  [place] MFM OB DETAIL +14 WK               76811.01    TANYA PRATT ----------------------------------------------------------------------  #  Order #                     Accession #                Episode #  1  [id number]                   [id number]                 [id number] ---------------------------------------------------------------------- Indications  Advanced maternal age multigravida 58+,        O24.522  second trimester (37 yo)  [redacted] weeks gestation of pregnancy                Z3A.27  Encounter for antenatal screening for          Z36.3  malformations  Declined NIPS ---------------------------------------------------------------------- Fetal Evaluation  Num Of Fetuses:         1  Fetal Heart Rate(bpm):  150  Cardiac Activity:       Observed  Presentation:           Cephalic  Placenta:               Posterior  P. Cord Insertion:      Visualized  Amniotic Fluid  AFI FV:      Within normal limits                              Largest Pocket(cm)                              6.32 ---------------------------------------------------------------------- Biometry  BPD:      76.4  mm     G. Age:  30w 5d         98  %    CI:        79.57   %    70  - 86                                                          FL/HC:      19.2   %    18.8 - 20.6  HC:      270.7  mm  G. Age:  29w 4d         73  %    HC/AC:      1.03        1.05 - 1.21  AC:      263.1  mm     G. Age:  30w 3d         97  %    FL/BPD:     68.1   %    71 - 87  FL:         52  mm     G. Age:  27w 5d         31  %    FL/AC:      19.8   %    20 - 24  HUM:      49.9  mm     G. Age:  29w 2d         75  %  CER:      34.9  mm     G. Age:  29w 2d         86  %  LV:        2.3  mm  Est. FW:    1405  gm      3 lb 2 oz     93  % ---------------------------------------------------------------------- OB History  Blood Type:   A+  Gravidity:    2         Term:   1        Prem:   0        SAB:   0  TOP:          0       Ectopic:  0        Living: 1 ---------------------------------------------------------------------- Gestational Age  LMP:           27w 6d        Date:  01/28/22                  EDD:   11/04/22  U/S Today:     29w 4d                                        EDD:   10/23/22  Best:          27w 6d     Det. By:  LMP  (01/28/22)          EDD:   11/04/22 ---------------------------------------------------------------------- Anatomy  Cranium:               Appears normal         LVOT:                   Appears normal  Cavum:                 Appears normal         Aortic Arch:            Appears normal  Ventricles:            Appears normal         Ductal Arch:            Not well visualized  Choroid Plexus:        Appears normal         Diaphragm:  Appears normal  Cerebellum:            Appears normal         Stomach:                Appears normal, left                                                                        sided  Posterior Fossa:       Appears normal         Abdomen:                Appears normal  Nuchal Fold:           Not applicable (>20    Abdominal Wall:         Not well visualized                         wks GA)  Face:                  Orbits nl; profile not Cord Vessels:            Appears normal (3                         well visualized                                vessel cord)  Lips:                  Appears normal         Kidneys:                Appear normal  Palate:                Not well visualized    Bladder:                Appears normal  Thoracic:              Appears normal         Spine:                  Appears normal  Heart:                 Appears normal         Upper Extremities:      Lt appears nml; Rt                         (4CH, axis, and                                NWV                         situs)  RVOT:                  Appears normal         Lower Extremities:  Appears normal  Other:  Fetus appears to be a female. Nasal bone visualized. Lt foot, Lt hand,          and Rt heel visualized. VC, 3VV and 3VTV visualized. Technically          difficult due to advanced GA and fetal position. ---------------------------------------------------------------------- Cervix Uterus Adnexa  Cervix  Not visualized (advanced GA >24wks)  Uterus  No abnormality visualized.  Right Ovary  Within normal limits.  Left Ovary  Within normal limits.  Cul De Sac  No free fluid seen.  Adnexa  No abnormality visualized ---------------------------------------------------------------------- Impression  G2 P1001.  Patient is here for fetal anatomy scan.  Her  pregnancy is dated by sure LMP dates.  Advanced maternal age.  Patient had opted not to screen for  fetal aneuploidies.  Obstetric history is significant for a term vaginal delivery in  2020 of a female infant weighing 8 pounds and 12 ounces at  birth.  We performed fetal anatomy scan. No makers of  aneuploidies or fetal structural defects are seen. Fetal  biometry is consistent with her previously-established dates.  Amniotic fluid is normal and good fetal activity is seen.  Patient understands the limitations of ultrasound in detecting  fetal anomalies. ----------------------------------------------------------------------  Recommendations  -An appointment was made for her to return in 4 weeks for  fetal growth assessment and completion of fetal anatomy.  -Screen for GDM. ----------------------------------------------------------------------                 Noralee Space, MD Electronically Signed Final Report   08/11/2022 03:32 pm ----------------------------------------------------------------------   Assessment and Plan:  Pregnancy: G2P1001 at [redacted]w[redacted]d 1. Advanced maternal age in multigravida, third trimester 2. [redacted] weeks gestation of pregnancy 3. Supervision of high risk pregnancy, antepartum GTT and labs today.  Declined Tdap. Follow up growth san as per MFM. Preterm labor symptoms and general obstetric precautions including but not limited to vaginal bleeding, contractions, leaking of fluid and fetal movement were reviewed in detail with the patient. Please refer to After Visit Summary for other counseling recommendations.   Return in about 2 weeks (around 08/28/2022) for OFFICE OB VISIT (MD or APP).  Future Appointments  Date Time Provider Department Center  08/14/2022 11:15 AM Sakura Denis, Jethro Bastos, MD [facility]  09/02/2022  1:30 PM Vitaliy Eisenhour, Jethro Bastos, MD [facility]  09/15/2022  4:10 PM Macon Large Jethro Bastos, MD [facility]  09/23/2022  1:15 PM [facility] NURSE [facility]  09/23/2022  1:30 PM [facility] US2 [facility]  09/29/2022  4:10 PM Shemar Plemmons, Jethro Bastos, MD [facility]  10/15/2022  2:30 PM Reva Bores, MD [facility]  10/23/2022  3:30 PM Kahlani Graber, Jethro Bastos, MD [facility]    Jaynie Collins, MD

## 2022-08-14 NOTE — Patient Instructions (Signed)
Return to office for any scheduled appointments. Call the office or go to the MAU at [facility] if: You begin to have strong, frequent contractions Your water breaks.  Sometimes it is a big gush of fluid, sometimes it is just a trickle that keeps getting your underwear wet or running down your legs You have vaginal bleeding.  It is normal to have a small amount of spotting if your cervix was checked.  You do not feel your baby moving like normal.  If you do not, get something to eat and drink and lay down and focus on feeling your baby move.   If your baby is still not moving like normal, you should call the office or go to MAU. Any other obstetric concerns.  

## 2022-08-14 NOTE — Progress Notes (Signed)
ROB  2hr GTT   Tdap offered : Declines   CC: None

## 2022-08-15 ENCOUNTER — Encounter: Payer: Self-pay | Admitting: Obstetrics & Gynecology

## 2022-08-15 DIAGNOSIS — O24419 Gestational diabetes mellitus in pregnancy, unspecified control: Secondary | ICD-10-CM | POA: Insufficient documentation

## 2022-08-15 LAB — HIV ANTIBODY (ROUTINE TESTING W REFLEX): HIV Screen 4th Generation wRfx: NONREACTIVE

## 2022-08-15 LAB — GLUCOSE TOLERANCE, 2 HOURS W/ 1HR
Glucose, 1 hour: 98 mg/dL (ref 70–179)
Glucose, 2 hour: 100 mg/dL (ref 70–152)
Glucose, Fasting: 93 mg/dL — ABNORMAL HIGH (ref 70–91)

## 2022-08-15 LAB — CBC
Hematocrit: 36.5 % (ref 34.0–46.6)
Hemoglobin: 12.2 g/dL (ref 11.1–15.9)
MCH: 32.3 pg (ref 26.6–33.0)
MCHC: 33.4 g/dL (ref 31.5–35.7)
MCV: 97 fL (ref 79–97)
Platelets: 206 10*3/uL (ref 150–450)
RBC: 3.78 x10E6/uL (ref 3.77–5.28)
RDW: 13 % (ref 11.7–15.4)
WBC: 8.2 10*3/uL (ref 3.4–10.8)

## 2022-08-15 LAB — RPR: RPR Ser Ql: NONREACTIVE

## 2022-08-15 MED ORDER — FREESTYLE LANCETS MISC: 12 refills | Status: DC

## 2022-08-15 MED ORDER — FREESTYLE LITE TEST VI STRP: ORAL_STRIP | 12 refills | Status: DC

## 2022-08-15 MED ORDER — FREESTYLE LITE DEVI: 0 refills | Status: DC

## 2022-08-25 ENCOUNTER — Telehealth: Payer: Self-pay | Admitting: *Deleted

## 2022-08-25 ENCOUNTER — Other Ambulatory Visit: Payer: Self-pay | Admitting: *Deleted

## 2022-08-25 MED ORDER — TERCONAZOLE 0.8 % VA CREA: 1.0000 | TOPICAL_CREAM | Freq: Every day | VAGINAL | 0 refills | Status: DC

## 2022-08-25 NOTE — Telephone Encounter (Signed)
Pt called concerned with some symptoms she is having, pt is having vaginal itching and irritation, informed pt that it sounds like a yeast infection and will send in cream and if not better by Friday, pt will call first thing to come in for a self swab

## 2022-09-02 ENCOUNTER — Other Ambulatory Visit: Payer: BLUE CROSS/BLUE SHIELD

## 2022-09-02 ENCOUNTER — Encounter: Payer: BLUE CROSS/BLUE SHIELD | Admitting: Obstetrics & Gynecology

## 2022-09-02 DIAGNOSIS — O0990 Supervision of high risk pregnancy, unspecified, unspecified trimester: Secondary | ICD-10-CM

## 2022-09-03 ENCOUNTER — Encounter: Payer: Self-pay | Admitting: Obstetrics & Gynecology

## 2022-09-03 LAB — GLUCOSE TOLERANCE, 2 HOURS W/ 1HR
Glucose, 1 hour: 126 mg/dL (ref 70–179)
Glucose, 2 hour: 111 mg/dL (ref 70–152)
Glucose, Fasting: 86 mg/dL (ref 70–91)

## 2022-09-03 NOTE — Progress Notes (Signed)
Patient does not have gestational diabetes.  Last GTT on 08/14/22 was done and the fasting value was erroneous as she consumed something with a little sugar in it prior to that test.  Normal 2 hr GTT on 09/02/22.   Jaynie Collins, MD

## 2022-09-04 ENCOUNTER — Ambulatory Visit (INDEPENDENT_AMBULATORY_CARE_PROVIDER_SITE_OTHER): Payer: BLUE CROSS/BLUE SHIELD | Admitting: Obstetrics & Gynecology

## 2022-09-04 VITALS — BP 114/78 | HR 96 | Wt 190.0 lb

## 2022-09-04 DIAGNOSIS — Z3A31 31 weeks gestation of pregnancy: Secondary | ICD-10-CM

## 2022-09-04 DIAGNOSIS — O09523 Supervision of elderly multigravida, third trimester: Secondary | ICD-10-CM

## 2022-09-04 DIAGNOSIS — Z3483 Encounter for supervision of other normal pregnancy, third trimester: Secondary | ICD-10-CM

## 2022-09-04 NOTE — Progress Notes (Signed)
PRENATAL VISIT NOTE  Subjective:  Katrina Tate is a 37 y.o. G2P1001 at [redacted]w[redacted]d being seen today for ongoing prenatal care.  She is currently monitored for the following issues for this high-risk pregnancy and has Rubella non-immune status, antepartum; Supervision of normal pregnancy in third trimester; and Advanced maternal age in multigravida, third trimester on their problem list.  Patient reports mild backache. She slipped and fell on stairs and hit her back two days ago.  No VB, had good FM, did not go to hospital.  Put cold compress on her back and took Tylenol for pain which is markedly improved.  Contractions: Not present. Vag. Bleeding: None.  Movement: Present. Denies leaking of fluid.   The following portions of the patient's history were reviewed and updated as appropriate: allergies, current medications, past family history, past medical history, past social history, past surgical history and problem list.   Objective:   Vitals:   09/04/22 1130  BP: 114/78  Pulse: 96  Weight: 190 lb (86.2 kg)    Fetal Status: Fetal Heart Rate (bpm): 136   Movement: Present     General:  Alert, oriented and cooperative. Patient is in no acute distress.  Skin: Skin is warm and dry. No rash noted.   Cardiovascular: Normal heart rate noted  Respiratory: Normal respiratory effort, no problems with respiration noted  Abdomen: Soft, gravid, appropriate for gestational age.  Pain/Pressure: Absent     Pelvic: Cervical exam deferred        Extremities: Normal range of motion.  Edema: None  Mental Status: Normal mood and affect. Normal behavior. Normal judgment and thought content.   Imaging: [place] MFM OB DETAIL +14 WK  Result Date: 08/11/2022 ----------------------------------------------------------------------  OBSTETRICS REPORT                       (Signed Final 08/11/2022 03:32 pm) ---------------------------------------------------------------------- Patient Info  ID #:       [id number]                           D.O.B.:  12/07/1985 (37 yrs)  Name:       Katrina Tate           Visit Date: 08/11/2022 01:39 pm ---------------------------------------------------------------------- Performed By  Attending:        Noralee Space MD        Ref. Address:     [address]                                                             [place], [place]                                                             [address]  Performed By:     Anabel Halon          [facility]  Referred By:      Reva Bores                    MD ---------------------------------------------------------------------- Orders  #  Description                           Code        Ordered By  1  [place] MFM OB DETAIL +14 WK               76811.01    TANYA PRATT ----------------------------------------------------------------------  #  Order #                     Accession #                Episode #  1  [id number]                   [id number]                 [id number] ---------------------------------------------------------------------- Indications  Advanced maternal age multigravida 33+,        O28.522  second trimester (37 yo)  [redacted] weeks gestation of pregnancy                Z3A.27  Encounter for antenatal screening for          Z36.3  malformations  Declined NIPS ---------------------------------------------------------------------- Fetal Evaluation  Num Of Fetuses:         1  Fetal Heart Rate(bpm):  150  Cardiac Activity:       Observed  Presentation:           Cephalic  Placenta:               Posterior  P. Cord Insertion:      Visualized  Amniotic Fluid  AFI FV:      Within normal limits                              Largest Pocket(cm)                              6.32  ---------------------------------------------------------------------- Biometry  BPD:      76.4  mm     G. Age:  30w 5d         98  %    CI:        79.57   %    70 - 86  FL/HC:      19.2   %    18.8 - 20.6  HC:      270.7  mm     G. Age:  29w 4d         73  %    HC/AC:      1.03        1.05 - 1.21  AC:      263.1  mm     G. Age:  30w 3d         97  %    FL/BPD:     68.1   %    71 - 87  FL:         52  mm     G. Age:  27w 5d         31  %    FL/AC:      19.8   %    20 - 24  HUM:      49.9  mm     G. Age:  29w 2d         75  %  CER:      34.9  mm     G. Age:  29w 2d         86  %  LV:        2.3  mm  Est. FW:    1405  gm      3 lb 2 oz     93  % ---------------------------------------------------------------------- OB History  Blood Type:   A+  Gravidity:    2         Term:   1        Prem:   0        SAB:   0  TOP:          0       Ectopic:  0        Living: 1 ---------------------------------------------------------------------- Gestational Age  LMP:           27w 6d        Date:  01/28/22                  EDD:   11/04/22  U/S Today:     29w 4d                                        EDD:   10/23/22  Best:          27w 6d     Det. By:  LMP  (01/28/22)          EDD:   11/04/22 ---------------------------------------------------------------------- Anatomy  Cranium:               Appears normal         LVOT:                   Appears normal  Cavum:                 Appears normal         Aortic Arch:            Appears normal  Ventricles:            Appears normal         Ductal Arch:  Not well visualized  Choroid Plexus:        Appears normal         Diaphragm:              Appears normal  Cerebellum:            Appears normal         Stomach:                Appears normal, left                                                                        sided  Posterior Fossa:       Appears normal         Abdomen:                Appears normal  Nuchal Fold:            Not applicable (>20    Abdominal Wall:         Not well visualized                         wks GA)  Face:                  Orbits nl; profile not Cord Vessels:           Appears normal (3                         well visualized                                vessel cord)  Lips:                  Appears normal         Kidneys:                Appear normal  Palate:                Not well visualized    Bladder:                Appears normal  Thoracic:              Appears normal         Spine:                  Appears normal  Heart:                 Appears normal         Upper Extremities:      Lt appears nml; Rt                         (4CH, axis, and                                NWV  situs)  RVOT:                  Appears normal         Lower Extremities:      Appears normal  Other:  Fetus appears to be a female. Nasal bone visualized. Lt foot, Lt hand,          and Rt heel visualized. VC, 3VV and 3VTV visualized. Technically          difficult due to advanced GA and fetal position. ---------------------------------------------------------------------- Cervix Uterus Adnexa  Cervix  Not visualized (advanced GA >24wks)  Uterus  No abnormality visualized.  Right Ovary  Within normal limits.  Left Ovary  Within normal limits.  Cul De Sac  No free fluid seen.  Adnexa  No abnormality visualized ---------------------------------------------------------------------- Impression  G2 P1001.  Patient is here for fetal anatomy scan.  Her  pregnancy is dated by sure LMP dates.  Advanced maternal age.  Patient had opted not to screen for  fetal aneuploidies.  Obstetric history is significant for a term vaginal delivery in  2020 of a female infant weighing 8 pounds and 12 ounces at  birth.  We performed fetal anatomy scan. No makers of  aneuploidies or fetal structural defects are seen. Fetal  biometry is consistent with her previously-established dates.  Amniotic fluid is normal and good fetal activity is  seen.  Patient understands the limitations of ultrasound in detecting  fetal anomalies. ---------------------------------------------------------------------- Recommendations  -An appointment was made for her to return in 4 weeks for  fetal growth assessment and completion of fetal anatomy.  -Screen for GDM. ----------------------------------------------------------------------                 Noralee Space, MD Electronically Signed Final Report   08/11/2022 03:32 pm ----------------------------------------------------------------------   Assessment and Plan:  Pregnancy: G2P1001 at [redacted]w[redacted]d 1. Advanced maternal age in multigravida, third trimester 2. [redacted] weeks gestation of pregnancy 3. Encounter for supervision of other normal pregnancy in third trimester Doing well. Advised to go to MAU if she has any more falls for evaluation. Next growth scan is on 09/23/22. Labor symptoms and general obstetric precautions including but not limited to vaginal bleeding, contractions, leaking of fluid and fetal movement were reviewed in detail with the patient. Please refer to After Visit Summary for other counseling recommendations.   Return in about 11 days (around 09/15/2022) for OB visits as scheduled.  Future Appointments  Date Time Provider Department Center  09/15/2022  4:10 PM Tereso Newcomer, MD [facility]  09/23/2022  1:15 PM [facility] NURSE [facility]  09/23/2022  1:30 PM [facility]2 [facility]  09/29/2022  4:10 PM Tahisha Hakim, Jethro Bastos, MD [facility]  10/15/2022  2:30 PM Reva Bores, MD [facility]  10/23/2022  3:30 PM Karne Ozga, Jethro Bastos, MD [facility]    Jaynie Collins, MD

## 2022-09-04 NOTE — Patient Instructions (Signed)
Return to office for any scheduled appointments. Call the office or go to the MAU at [facility] [place] if: You begin to have strong, frequent contractions Your water breaks.  Sometimes it is a big gush of fluid, sometimes it is just a trickle that keeps getting your underwear wet or running down your legs You have vaginal bleeding.  It is normal to have a small amount of spotting if your cervix was checked.  You do not feel your baby moving like normal.  If you do not, get something to eat and drink and lay down and focus on feeling your baby move.   If your baby is still not moving like normal, you should call the office or go to MAU. Any other obstetric concerns.  

## 2022-09-15 ENCOUNTER — Other Ambulatory Visit (HOSPITAL_COMMUNITY)
Admission: RE | Admit: 2022-09-15 | Discharge: 2022-09-15 | Disposition: A | Discharge status: Home / Self Care | Payer: BLUE CROSS/BLUE SHIELD | Source: Ambulatory Visit | Attending: Obstetrics & Gynecology | Admitting: Obstetrics & Gynecology

## 2022-09-15 ENCOUNTER — Encounter: Payer: Self-pay | Admitting: Obstetrics & Gynecology

## 2022-09-15 ENCOUNTER — Ambulatory Visit (INDEPENDENT_AMBULATORY_CARE_PROVIDER_SITE_OTHER): Payer: BLUE CROSS/BLUE SHIELD | Admitting: Obstetrics & Gynecology

## 2022-09-15 VITALS — BP 121/79 | HR 85 | Wt 190.0 lb

## 2022-09-15 DIAGNOSIS — B3731 Acute candidiasis of vulva and vagina: Secondary | ICD-10-CM

## 2022-09-15 DIAGNOSIS — Z113 Encounter for screening for infections with a predominantly sexual mode of transmission: Secondary | ICD-10-CM | POA: Diagnosis not present

## 2022-09-15 DIAGNOSIS — O23593 Infection of other part of genital tract in pregnancy, third trimester: Secondary | ICD-10-CM

## 2022-09-15 DIAGNOSIS — Z3A32 32 weeks gestation of pregnancy: Secondary | ICD-10-CM

## 2022-09-15 DIAGNOSIS — N760 Acute vaginitis: Secondary | ICD-10-CM

## 2022-09-15 DIAGNOSIS — O4693 Antepartum hemorrhage, unspecified, third trimester: Secondary | ICD-10-CM | POA: Insufficient documentation

## 2022-09-15 DIAGNOSIS — Z3483 Encounter for supervision of other normal pregnancy, third trimester: Secondary | ICD-10-CM

## 2022-09-15 DIAGNOSIS — O09523 Supervision of elderly multigravida, third trimester: Secondary | ICD-10-CM

## 2022-09-15 MED ORDER — FLUCONAZOLE 150 MG PO TABS: 150.0000 mg | ORAL_TABLET | Freq: Once | ORAL | 1 refills | Status: AC

## 2022-09-15 MED ORDER — NYSTATIN 100000 UNIT/GM EX CREA: 1.0000 | TOPICAL_CREAM | Freq: Two times a day (BID) | CUTANEOUS | 1 refills | Status: DC

## 2022-09-15 NOTE — Patient Instructions (Signed)

## 2022-09-15 NOTE — Progress Notes (Signed)
   PRENATAL VISIT NOTE  Subjective:  Katrina Tate is a 37 y.o. G2P1001 at [redacted]w[redacted]d being seen today for ongoing prenatal care.  She is currently monitored for the following issues for this low-risk pregnancy and has Rubella non-immune status, antepartum; Supervision of normal pregnancy in third trimester; and Advanced maternal age in multigravida, third trimester on their problem list.  Patient reports vaginal irritation, not alleviated by recent Terazol 3. Also increased external irritation.   Contractions: Not present. Vag. Bleeding: None.  Movement: Present. Denies leaking of fluid.   The following portions of the patient's history were reviewed and updated as appropriate: allergies, current medications, past family history, past medical history, past social history, past surgical history and problem list.   Objective:   Vitals:   09/15/22 1616  BP: 121/79  Pulse: 85  Weight: 190 lb (86.2 kg)    Fetal Status: Fetal Heart Rate (bpm): 146   Movement: Present     General:  Alert, oriented and cooperative. Patient is in no acute distress.  Skin: Skin is warm and dry. No rash noted.   Cardiovascular: Normal heart rate noted  Respiratory: Normal respiratory effort, no problems with respiration noted  Abdomen: Soft, gravid, appropriate for gestational age.  Pain/Pressure: Present     Pelvic: Diffusely erythematous and edematous labia majora, entire vulva involved.  Yellow discharge seen, testing sample obtained. Exam performed in the presence of a chaperone        Extremities: Normal range of motion.  Edema: Trace  Mental Status: Normal mood and affect. Normal behavior. Normal judgment and thought content.   Assessment and Plan:  Pregnancy: G2P1001 at [redacted]w[redacted]d 1. Vulvovaginitis in pregnancy in third trimester Likely yeast vulvovaginitis; Diflucan and Nystatin cream prescribed. Will also follow up test results and manage accordingly. Proper vulvar hygiene emphasized: discussed  avoidance of perfumed soaps, detergents, lotions and any type of douches; in addition to wearing cotton underwear and no underwear at night.  Also recommended cleaning front to back, voiding and cleaning up after intercourse.  - Cervicovaginal ancillary only( [place]) - fluconazole (DIFLUCAN) 150 MG tablet; Take 1 tablet (150 mg total) by mouth once for 1 dose. Can take additional dose three days later if symptoms persist  Dispense: 1 tablet; Refill: 1 - nystatin cream (MYCOSTATIN); Apply 1 Application topically 2 (two) times daily. Until symptoms resolve  Dispense: 30 g; Refill: 1  2. Advanced maternal age in multigravida, third trimester 3. [redacted] weeks gestation of pregnancy 4. Encounter for supervision of other normal pregnancy in third trimester Next ultrasound is on 09/23/22. No other concerns. Preterm labor symptoms and general obstetric precautions including but not limited to vaginal bleeding, contractions, leaking of fluid and fetal movement were reviewed in detail with the patient. Please refer to After Visit Summary for other counseling recommendations.   Return in about 2 weeks (around 09/29/2022) for OB visits as scheduled.  Future Appointments  Date Time Provider Department Center  09/23/2022  1:15 PM [facility] NURSE [facility]  09/23/2022  1:30 PM [facility] US2 [facility]  09/29/2022  4:10 PM Vidya Bamford, Jethro Bastos, MD [facility]  10/15/2022  2:30 PM Reva Bores, MD [facility]  10/23/2022  3:30 PM Dawanna Grauberger, Jethro Bastos, MD [facility]    Jaynie Collins, MD

## 2022-09-17 LAB — CERVICOVAGINAL ANCILLARY ONLY
Bacterial Vaginitis (gardnerella): NEGATIVE
Candida Glabrata: NEGATIVE
Candida Vaginitis: POSITIVE — AB
Chlamydia: NEGATIVE
Comment: NEGATIVE
Comment: NEGATIVE
Comment: NEGATIVE
Comment: NEGATIVE
Comment: NEGATIVE
Comment: NORMAL
Neisseria Gonorrhea: NEGATIVE
Trichomonas: NEGATIVE

## 2022-09-23 ENCOUNTER — Ambulatory Visit: Payer: BLUE CROSS/BLUE SHIELD | Admitting: *Deleted

## 2022-09-23 ENCOUNTER — Encounter: Payer: Self-pay | Admitting: *Deleted

## 2022-09-23 ENCOUNTER — Other Ambulatory Visit: Payer: Self-pay | Admitting: *Deleted

## 2022-09-23 ENCOUNTER — Ambulatory Visit: Payer: BLUE CROSS/BLUE SHIELD | Attending: Obstetrics and Gynecology

## 2022-09-23 VITALS — BP 115/66 | HR 79

## 2022-09-23 DIAGNOSIS — Z2839 Other underimmunization status: Secondary | ICD-10-CM | POA: Diagnosis present

## 2022-09-23 DIAGNOSIS — Z3A34 34 weeks gestation of pregnancy: Secondary | ICD-10-CM

## 2022-09-23 DIAGNOSIS — O3663X1 Maternal care for excessive fetal growth, third trimester, fetus 1: Secondary | ICD-10-CM

## 2022-09-23 DIAGNOSIS — O09523 Supervision of elderly multigravida, third trimester: Secondary | ICD-10-CM | POA: Insufficient documentation

## 2022-09-23 DIAGNOSIS — O09899 Supervision of other high risk pregnancies, unspecified trimester: Secondary | ICD-10-CM | POA: Insufficient documentation

## 2022-09-23 DIAGNOSIS — O3663X0 Maternal care for excessive fetal growth, third trimester, not applicable or unspecified: Secondary | ICD-10-CM | POA: Diagnosis not present

## 2022-09-29 ENCOUNTER — Ambulatory Visit: Payer: BLUE CROSS/BLUE SHIELD | Admitting: Obstetrics & Gynecology

## 2022-09-29 VITALS — BP 116/78 | HR 90 | Wt 192.0 lb

## 2022-09-29 DIAGNOSIS — Z3A34 34 weeks gestation of pregnancy: Secondary | ICD-10-CM

## 2022-09-29 DIAGNOSIS — O3663X0 Maternal care for excessive fetal growth, third trimester, not applicable or unspecified: Secondary | ICD-10-CM | POA: Insufficient documentation

## 2022-09-29 DIAGNOSIS — Z3483 Encounter for supervision of other normal pregnancy, third trimester: Secondary | ICD-10-CM

## 2022-09-29 NOTE — Progress Notes (Signed)
PRENATAL VISIT NOTE  Subjective:  Katrina Tate is a 37 y.o. G2P1001 at [redacted]w[redacted]d being seen today for ongoing prenatal care.  She is currently monitored for the following issues for this low-risk pregnancy and has Rubella non-immune status, antepartum; Supervision of normal pregnancy in third trimester; Advanced maternal age in multigravida, third trimester; and LGA (large for gestational age) fetus affecting management of mother, third trimester on their problem list.  Patient reports no complaints.  Contractions: Not present. Vag. Bleeding: None.  Movement: Present. Denies leaking of fluid.   The following portions of the patient's history were reviewed and updated as appropriate: allergies, current medications, past family history, past medical history, past social history, past surgical history and problem list.   Objective:   Vitals:   09/29/22 1632  BP: 116/78  Pulse: 90  Weight: 192 lb (87.1 kg)    Fetal Status: Fetal Heart Rate (bpm): 133   Movement: Present     General:  Alert, oriented and cooperative. Patient is in no acute distress.  Skin: Skin is warm and dry. No rash noted.   Cardiovascular: Normal heart rate noted  Respiratory: Normal respiratory effort, no problems with respiration noted  Abdomen: Soft, gravid, appropriate for gestational age.  Pain/Pressure: Present     Pelvic: Cervical exam deferred        Extremities: Normal range of motion.  Edema: Trace  Mental Status: Normal mood and affect. Normal behavior. Normal judgment and thought content.   [place] MFM OB FOLLOW UP  Result Date: 09/23/2022 ----------------------------------------------------------------------  OBSTETRICS REPORT                       (Signed Final 09/23/2022 02:29 pm) ---------------------------------------------------------------------- Patient Info  ID #:       [id number]                          D.O.B.:  05-Apr-1985 (37 yrs)  Name:       Katrina Tate           Visit Date:  09/23/2022 02:03 pm ---------------------------------------------------------------------- Performed By  Attending:        Braxton Feathers DO       Ref. Address:     [address]                                                             [place], [place]                                                             [address]  Performed By:     Dennis Bast RDMS      [facility]  Referred By:      Reva Bores                    MD ---------------------------------------------------------------------- Orders  #  Description                           Code        Ordered By  1  [place] MFM OB FOLLOW UP                   86761.95    Noralee Space ----------------------------------------------------------------------  #  Order #                     Accession #                Episode #  1  [id number]                   [id number]                 [id number] ---------------------------------------------------------------------- Indications  Advanced maternal age multigravida 50+,        O67.523  third trimester  Large for gestational age fetus affecting      O36.60X0  management of mother  [redacted] weeks gestation of pregnancy                Z3A.34  Declined NIPS  Encounter for antenatal screening for          Z36.3  malformations ---------------------------------------------------------------------- Vital Signs  BP:          115/66 ---------------------------------------------------------------------- Fetal Evaluation  Num Of Fetuses:         1  Fetal Heart Rate(bpm):  129  Cardiac Activity:       Observed  Presentation:           Cephalic  Placenta:               Posterior Fundal  P. Cord Insertion:      Visualized  Amniotic Fluid  AFI FV:      Within normal limits  AFI Sum(cm)     %Tile       Largest  Pocket(cm)  15.32           55          6.19  RUQ(cm)       RLQ(cm)       LUQ(cm)        LLQ(cm)  6.19          3.52          2.66           2.95 ---------------------------------------------------------------------- Biometry  BPD:  87.9  mm     G. Age:  35w 4d         86  %    CI:        77.57   %    70 - 86                                                          FL/HC:      21.3   %    19.4 - 21.8  HC:      315.9  mm     G. Age:  35w 3d         52  %    HC/AC:      0.95        0.96 - 1.11  AC:      331.2  mm     G. Age:  37w 0d       > 99  %    FL/BPD:     76.5   %    71 - 87  FL:       67.2  mm     G. Age:  34w 4d         56  %    FL/AC:      20.3   %    20 - 24  HUM:      58.3  mm     G. Age:  33w 5d         53  %  LV:        5.1  mm  Est. FW:    2843  gm      6 lb 4 oz     94  % ---------------------------------------------------------------------- OB History  Blood Type:   A+  Gravidity:    2         Term:   1        Prem:   0        SAB:   0  TOP:          0       Ectopic:  0        Living: 1 ---------------------------------------------------------------------- Gestational Age  LMP:           34w 0d        Date:  01/28/22                  EDD:   11/04/22  U/S Today:     35w 5d                                        EDD:   10/23/22  Best:          34w 0d     Det. By:  LMP  (01/28/22)          EDD:   11/04/22 ---------------------------------------------------------------------- Anatomy  Cranium:               Appears normal         LVOT:                   Appears normal  Cavum:  Previously seen        Aortic Arch:            Appears normal  Ventricles:            Appears normal         Ductal Arch:            Appears normal  Choroid Plexus:        Previously seen        Diaphragm:              Appears normal  Cerebellum:            Previously seen        Stomach:                Appears normal, left                                                                        sided  Posterior Fossa:        Previously seen        Abdomen:                Appears normal  Nuchal Fold:           Not applicable (>20    Abdominal Wall:         Not well visualized                         wks GA)  Face:                  Appears normal (       Cord Vessels:           Previously seen                         profile)  Lips:                  Appears normal         Kidneys:                Appear normal  Palate:                Not well visualized    Bladder:                Appears normal  Thoracic:              Previously seen        Spine:                  Previously seen  Heart:                 Appears normal         Upper Extremities:      Lt appears nml; Rt                         (4CH, axis, and  NWV                         situs)  RVOT:                  Appears normal         Lower Extremities:      Previously seen  Other:  VC, 3VV and 3VTV previously visualized. Technically difficult due to          advanced gestational age. ---------------------------------------------------------------------- Comments  The patient is here for ultrasound at 34w 0d. EDD:  11/04/2022 dated by LMP  (01/28/22).  Her pregnancy is  complicated by AMA and LGA.  She has no further concerns  today.  Sonographic findings  Single intrauterine pregnancy.  Fetal cardiac activity:  Observed and appears normal.  Presentation: Cephalic.  Interval fetal anatomy appears normal.  Fetal biometry shows the estimated fetal weight at the 94  percentile.  Amniotic fluid volume: Within normal limits. MVP: 6.19 cm.  Placenta: Posterior Fundal.  Recommendations  -F/u growth in 4 weeks due to LGA. ----------------------------------------------------------------------                  Braxton Feathers, DO Electronically Signed Final Report   09/23/2022 02:29 pm ----------------------------------------------------------------------    Assessment and Plan:  Pregnancy: G2P1001 at [redacted]w[redacted]d 1. LGA (large for gestational age) fetus affecting  management of mother, third trimester Follow up MFM scan on 10/21/22.   2. [redacted] weeks gestation of pregnancy 3. Encounter for supervision of other normal pregnancy in third trimester No other concerns.  Preterm labor symptoms and general obstetric precautions including but not limited to vaginal bleeding, contractions, leaking of fluid and fetal movement were reviewed in detail with the patient. Please refer to After Visit Summary for other counseling recommendations.   No follow-ups on file.  Future Appointments  Date Time Provider Department Center  10/15/2022  2:30 PM Reva Bores, MD [facility]  10/21/2022  3:30 PM [facility] US3 [facility]  10/23/2022  3:30 PM Wretha Laris, Jethro Bastos, MD [facility]    Jaynie Collins, MD

## 2022-09-30 ENCOUNTER — Encounter: Payer: Self-pay | Admitting: Obstetrics & Gynecology

## 2022-10-09 ENCOUNTER — Other Ambulatory Visit: Payer: Self-pay | Admitting: *Deleted

## 2022-10-09 MED ORDER — FLUCONAZOLE 150 MG PO TABS: 150.0000 mg | ORAL_TABLET | Freq: Once | ORAL | 0 refills | Status: AC

## 2022-10-15 ENCOUNTER — Ambulatory Visit (INDEPENDENT_AMBULATORY_CARE_PROVIDER_SITE_OTHER): Payer: BLUE CROSS/BLUE SHIELD | Admitting: Family Medicine

## 2022-10-15 ENCOUNTER — Other Ambulatory Visit (HOSPITAL_COMMUNITY)
Admission: RE | Admit: 2022-10-15 | Discharge: 2022-10-15 | Disposition: A | Discharge status: Home / Self Care | Payer: BLUE CROSS/BLUE SHIELD | Source: Ambulatory Visit

## 2022-10-15 ENCOUNTER — Encounter: Payer: Self-pay | Admitting: Family Medicine

## 2022-10-15 VITALS — BP 129/85 | HR 105 | Wt 192.0 lb

## 2022-10-15 DIAGNOSIS — Z3483 Encounter for supervision of other normal pregnancy, third trimester: Secondary | ICD-10-CM | POA: Insufficient documentation

## 2022-10-15 NOTE — Progress Notes (Signed)
ROB  CC: Fit watch is alerting pt of increased HR. Pt concerned, denies any heart palpitations, no SOB, no chest pain Reports hip pain. Pt believes she is leaking unsure of what it is pt thinks she may still have a yeast infection sx's are the same as before.   O2 stat: 98 Pulse : 105

## 2022-10-15 NOTE — Progress Notes (Signed)
    PRENATAL VISIT NOTE  Subjective:  Katrina Tate is a 37 y.o. G2P1001 at [redacted]w[redacted]d being seen today for ongoing prenatal care.  She is currently monitored for the following issues for this low-risk pregnancy and has Rubella non-immune status, antepartum; Supervision of normal pregnancy in third trimester; Advanced maternal age in multigravida, third trimester; and LGA (large for gestational age) fetus affecting management of mother, third trimester on their problem list.  Patient reports vaginal irritation and discharge, white and drips into toilet, makes water cloudy.  Contractions: Not present. Vag. Bleeding: None.  Movement: Present. Denies leaking of fluid.   The following portions of the patient's history were reviewed and updated as appropriate: allergies, current medications, past family history, past medical history, past social history, past surgical history and problem list.   Objective:   Vitals:   10/15/22 1502  BP: 129/85  Pulse: (!) 105  Weight: 192 lb (87.1 kg)    Fetal Status:   Fundal Height: 33 cm Movement: Present  Presentation: Vertex  General:  Alert, oriented and cooperative. Patient is in no acute distress.  Skin: Skin is warm and dry. No rash noted.   Cardiovascular: Normal heart rate noted  Respiratory: Normal respiratory effort, no problems with respiration noted  Abdomen: Soft, gravid, appropriate for gestational age.  Pain/Pressure: Present     Pelvic: Cervical exam performed in the presence of a chaperone  Nitrazine is negative       Extremities: Normal range of motion.     Mental Status: Normal mood and affect. Normal behavior. Normal judgment and thought content.   Assessment and Plan:  Pregnancy: G2P1001 at [redacted]w[redacted]d 1. Encounter for supervision of other normal pregnancy in third trimester Will check cultures and treat accordingly Trial of boric acid to keep yeast away. - Cervicovaginal ancillary only( [place]) - Strep Gp B NAA  Preterm  labor symptoms and general obstetric precautions including but not limited to vaginal bleeding, contractions, leaking of fluid and fetal movement were reviewed in detail with the patient. Please refer to After Visit Summary for other counseling recommendations.   Return in 1 week (on 10/22/2022).  Future Appointments  Date Time Provider Department Center  10/21/2022  3:30 PM [facility]3 [facility]  10/23/2022  3:30 PM Anyanwu, Jethro Bastos, MD [facility]  10/30/2022  3:30 PM Anyanwu, Jethro Bastos, MD [facility]    Reva Bores, MD

## 2022-10-17 LAB — CERVICOVAGINAL ANCILLARY ONLY
Bacterial Vaginitis (gardnerella): NEGATIVE
Candida Glabrata: NEGATIVE
Candida Vaginitis: POSITIVE — AB
Chlamydia: NEGATIVE
Comment: NEGATIVE
Comment: NEGATIVE
Comment: NEGATIVE
Comment: NEGATIVE
Comment: NORMAL
Neisseria Gonorrhea: NEGATIVE

## 2022-10-17 LAB — STREP GP B NAA: Strep Gp B NAA: NEGATIVE

## 2022-10-17 MED ORDER — TERCONAZOLE 0.8 % VA CREA: 1.0000 | TOPICAL_CREAM | Freq: Every day | VAGINAL | 0 refills | Status: DC

## 2022-10-17 NOTE — Addendum Note (Signed)
Addended by: Reva Bores on: 10/17/2022 04:07 PM   Modules accepted: Orders

## 2022-10-21 ENCOUNTER — Ambulatory Visit: Payer: BLUE CROSS/BLUE SHIELD

## 2022-10-23 ENCOUNTER — Ambulatory Visit (INDEPENDENT_AMBULATORY_CARE_PROVIDER_SITE_OTHER): Payer: BLUE CROSS/BLUE SHIELD | Admitting: Obstetrics & Gynecology

## 2022-10-23 VITALS — BP 121/79 | HR 82 | Wt 194.2 lb

## 2022-10-23 DIAGNOSIS — Z3483 Encounter for supervision of other normal pregnancy, third trimester: Secondary | ICD-10-CM

## 2022-10-23 DIAGNOSIS — O09523 Supervision of elderly multigravida, third trimester: Secondary | ICD-10-CM

## 2022-10-23 DIAGNOSIS — O3663X0 Maternal care for excessive fetal growth, third trimester, not applicable or unspecified: Secondary | ICD-10-CM

## 2022-10-23 DIAGNOSIS — Z3A38 38 weeks gestation of pregnancy: Secondary | ICD-10-CM

## 2022-10-23 NOTE — Progress Notes (Signed)
   PRENATAL VISIT NOTE  Subjective:  Katrina Tate is a 37 y.o. G2P1001 at [redacted]w[redacted]d being seen today for ongoing prenatal care.  She is currently monitored for the following issues for this low-risk pregnancy and has Rubella non-immune status, antepartum; Supervision of normal pregnancy in third trimester; Advanced maternal age in multigravida, third trimester; and LGA (large for gestational age) fetus affecting management of mother, third trimester on their problem list.  Patient reports no complaints.  Contractions: Not present. Vag. Bleeding: None.  Movement: Present. Denies leaking of fluid.   The following portions of the patient's history were reviewed and updated as appropriate: allergies, current medications, past family history, past medical history, past social history, past surgical history and problem list.   Objective:   Vitals:   10/23/22 1601  BP: 121/79  Pulse: 82  Weight: 194 lb 3.2 oz (88.1 kg)    Fetal Status: Fetal Heart Rate (bpm): 135   Movement: Present     General:  Alert, oriented and cooperative. Patient is in no acute distress.  Skin: Skin is warm and dry. No rash noted.   Cardiovascular: Normal heart rate noted  Respiratory: Normal respiratory effort, no problems with respiration noted  Abdomen: Soft, gravid, appropriate for gestational age.  Pain/Pressure: Present     Pelvic: Cervical exam deferred        Extremities: Normal range of motion.  Edema: None  Mental Status: Normal mood and affect. Normal behavior. Normal judgment and thought content.   Assessment and Plan:  Pregnancy: G2P1001 at [redacted]w[redacted]d 1. LGA (large for gestational age) fetus affecting management of mother, third trimester Unable to do follow up scan scheduled on 10/21/22 due to cost ($700 per scan).  EFW was 94% on 09/23/22.  Fundal height is reassuring.  Discussed possible elective IOL at 40 weeks if concerned about fetal weight given her inability to do repeat scan.   2. Advanced  maternal age in multigravida, third trimester 3. [redacted] weeks gestation of pregnancy 4. Encounter for supervision of other normal pregnancy in third trimester No other concerns. Discussed natural cervical ripening techniques.  Term labor symptoms and general obstetric precautions including but not limited to vaginal bleeding, contractions, leaking of fluid and fetal movement were reviewed in detail with the patient. Please refer to After Visit Summary for other counseling recommendations.   Return in about 1 week (around 10/30/2022) for OFFICE OB VISIT (MD only).  Future Appointments  Date Time Provider Department Center  10/30/2022  3:30 PM Shamarion Coots, Jethro Bastos, MD [facility]    Jaynie Collins, MD

## 2022-10-23 NOTE — Patient Instructions (Signed)
  Cervical Ripening (to get your cervix ready for labor) : May try one or all:  Red Raspberry Leaf capsules:  two 300mg  or 400mg  tablets with each meal, 2-3 times a day  Potential Side Effects Of Raspberry Leaf:  Most women do not experience any side effects from drinking raspberry leaf tea. However, nausea and loose stools are possible     Evening Primrose Oil capsules: may take 1 to 3 capsules daily. May also prick one to release the oil and insert it into your vagina at night.  Some of the potential side effects:  Upset stomach  Loose stools or diarrhea  Headaches  Nausea   6 Dates a day (may taste better if warmed in microwave until soft). Found where raisins are in the grocery store     Safe Medications in Pregnancy  that are over the counter  Acne:  Benzoyl Peroxide  Salicylic Acid  *NO-Retin A  Backache/Headache:  Tylenol: 2 Regular strength every 4 hours OR               2 Extra strength every 6 hours   Colds/Coughs/Allergies: Allegra Benadryl (alcohol free) 25 mg every 6 hours as needed  Breath right strips  Claritin  Cepacol throat lozenges  Chloraseptic throat spray  Cold-Eeze- up to three times per day  Cough drops, alcohol free  Flonase Guaifenesin  Mucinex (plain/DM) Nasacort Robitussin DM (plain only, alcohol free)  Saline nasal spray/drops Steroid nasal sprays Sudafed (Pseudoephedrine) & Actifed * use only after [redacted] weeks gestation and if you do not have high blood pressure  Tylenol  Vicks Vaporub  Zicam Zinc lozenges  Zyrtec   Make sure to not take anything that has the active ingredient Phenylephrine   Constipation:  Colace  Ducolax suppositories  Fleet enema  Glycerin suppositories  Metamucil  Milk of magnesia  Miralax  Senokot  Smooth move tea   Diarrhea:  Kaopectate  Imodium A-D   *NO Pepto Bismol   Hemorrhoids:  Anusol  Anusol-HC  Preparation-H  Tucks   Indigestion:  Tums  Maalox  Mylanta  Nexium Pepcid   Zantac Prevacid Protonix Prilosec  Insomnia:  Benadryl 25 - 50 mg every 6 hours as needed  Tylenol PM  Unisom  Leg Cramps:  Tums  Magnesium tablets  Nausea/Vomiting:  Bonine  Dramamine  Emetrol  Ginger extract  Sea bands  Meclizine   Nausea medication to take during pregnancy:  Unisom (doxylamine succinate 25 mg tablets) Take one tablet daily at bedtime. If symptoms are not adequately controlled, the dose can be increased to a maximum recommended dose of two tablets daily (1/2 tablet in the morning, 1/2 tablet mid-afternoon and one at bedtime).  Vitamin B6 100mg  tablets. Take one tablet twice a day (up to 200 mg per day).   Skin Rashes:  Aveeno products  Benadryl cream or Benadryl tablets 25mg  every 6 hours as needed  Calamine Lotion  1% Hydrocortisone cream   Yeast infection:  Gyne-lotrimin 7  Monistat 3 or 7day   **If taking multiple medications, please check labels to avoid duplicating the same active ingredients  **Take medication as directed on the label ** ** Do not exceed 4000 mg of Tylenol/Acetaminophen in 24 hours**  **Do not take medications that contain Aspirin or Ibuprofen** Unless your doctor has prescribed low dose Aspirin for prevention of Pre-eclampsia

## 2022-10-25 ENCOUNTER — Encounter (HOSPITAL_COMMUNITY): Payer: Self-pay | Admitting: Obstetrics and Gynecology

## 2022-10-25 ENCOUNTER — Inpatient Hospital Stay (HOSPITAL_COMMUNITY)
Admission: AD | Admit: 2022-10-25 | Discharge: 2022-10-26 | DRG: 807 | Disposition: A | Discharge status: Home / Self Care | Payer: BLUE CROSS/BLUE SHIELD | Attending: Obstetrics and Gynecology | Admitting: Obstetrics and Gynecology

## 2022-10-25 DIAGNOSIS — O3663X0 Maternal care for excessive fetal growth, third trimester, not applicable or unspecified: Secondary | ICD-10-CM | POA: Diagnosis present

## 2022-10-25 DIAGNOSIS — O26893 Other specified pregnancy related conditions, third trimester: Secondary | ICD-10-CM | POA: Diagnosis present

## 2022-10-25 DIAGNOSIS — O09523 Supervision of elderly multigravida, third trimester: Secondary | ICD-10-CM | POA: Diagnosis not present

## 2022-10-25 DIAGNOSIS — Z3A38 38 weeks gestation of pregnancy: Secondary | ICD-10-CM

## 2022-10-25 LAB — CBC
HCT: 41 % (ref 36.0–46.0)
HCT: 36.6 % (ref 36.0–46.0)
Hemoglobin: 13.8 g/dL (ref 12.0–15.0)
Hemoglobin: 12.6 g/dL (ref 12.0–15.0)
MCH: 31.2 pg (ref 26.0–34.0)
MCH: 31.1 pg (ref 26.0–34.0)
MCHC: 33.7 g/dL (ref 30.0–36.0)
MCHC: 34.4 g/dL (ref 30.0–36.0)
MCV: 92.6 fL (ref 80.0–100.0)
MCV: 90.4 fL (ref 80.0–100.0)
Platelets: 231 10*3/uL (ref 150–400)
Platelets: 215 10*3/uL (ref 150–400)
RBC: 4.43 MIL/uL (ref 3.87–5.11)
RBC: 4.05 MIL/uL (ref 3.87–5.11)
RDW: 12.6 % (ref 11.5–15.5)
RDW: 12.3 % (ref 11.5–15.5)
WBC: 11.3 10*3/uL — ABNORMAL HIGH (ref 4.0–10.5)
WBC: 13.9 10*3/uL — ABNORMAL HIGH (ref 4.0–10.5)
nRBC: 0 % (ref 0.0–0.2)
nRBC: 0 % (ref 0.0–0.2)

## 2022-10-25 LAB — TYPE AND SCREEN
ABO/RH(D): A POS
Antibody Screen: NEGATIVE

## 2022-10-25 LAB — RPR: RPR Ser Ql: NONREACTIVE

## 2022-10-25 MED ORDER — WITCH HAZEL-GLYCERIN EX PADS: 1.0000 | MEDICATED_PAD | CUTANEOUS | Status: DC | PRN

## 2022-10-25 MED ORDER — SODIUM CHLORIDE 0.9% FLUSH: 3.0000 mL | INTRAVENOUS | Status: DC | PRN

## 2022-10-25 MED ORDER — ONDANSETRON HCL 4 MG/2ML IJ SOLN: 4.0000 mg | INTRAMUSCULAR | Status: DC | PRN

## 2022-10-25 MED ORDER — ZOLPIDEM TARTRATE 5 MG PO TABS: 5.0000 mg | ORAL_TABLET | Freq: Every evening | ORAL | Status: DC | PRN

## 2022-10-25 MED ORDER — BENZOCAINE-MENTHOL 20-0.5 % EX AERO
1.0000 | INHALATION_SPRAY | CUTANEOUS | Status: DC | PRN
  Administered 2022-10-25: 1 via TOPICAL
  Filled 2022-10-25: qty 56

## 2022-10-25 MED ORDER — SOD CITRATE-CITRIC ACID 500-334 MG/5ML PO SOLN: 30.0000 mL | ORAL | Status: DC | PRN

## 2022-10-25 MED ORDER — DIPHENHYDRAMINE HCL 25 MG PO CAPS: 25.0000 mg | ORAL_CAPSULE | Freq: Four times a day (QID) | ORAL | Status: DC | PRN

## 2022-10-25 MED ORDER — SIMETHICONE 80 MG PO CHEW: 80.0000 mg | CHEWABLE_TABLET | ORAL | Status: DC | PRN

## 2022-10-25 MED ORDER — OXYTOCIN 10 UNIT/ML IJ SOLN
INTRAMUSCULAR | Status: AC
  Administered 2022-10-25: 10 [IU] via INTRAMUSCULAR
  Filled 2022-10-25: qty 1

## 2022-10-25 MED ORDER — LACTATED RINGERS IV SOLN: 500.0000 mL | INTRAVENOUS | Status: DC | PRN

## 2022-10-25 MED ORDER — OXYTOCIN-SODIUM CHLORIDE 30-0.9 UT/500ML-% IV SOLN
2.5000 [IU]/h | INTRAVENOUS | Status: DC
  Filled 2022-10-25: qty 500

## 2022-10-25 MED ORDER — SENNOSIDES-DOCUSATE SODIUM 8.6-50 MG PO TABS: 2.0000 | ORAL_TABLET | ORAL | Status: DC

## 2022-10-25 MED ORDER — ACETAMINOPHEN 325 MG PO TABS: 650.0000 mg | ORAL_TABLET | ORAL | Status: DC | PRN

## 2022-10-25 MED ORDER — MISOPROSTOL 200 MCG PO TABS
ORAL_TABLET | ORAL | Status: AC
  Administered 2022-10-25: 800 ug
  Filled 2022-10-25: qty 4

## 2022-10-25 MED ORDER — SODIUM CHLORIDE 0.9 % IV SOLN: 250.0000 mL | INTRAVENOUS | Status: DC | PRN

## 2022-10-25 MED ORDER — TETANUS-DIPHTH-ACELL PERTUSSIS 5-2.5-18.5 LF-MCG/0.5 IM SUSY: 0.5000 mL | PREFILLED_SYRINGE | Freq: Once | INTRAMUSCULAR | Status: DC

## 2022-10-25 MED ORDER — PRENATAL MULTIVITAMIN CH: 1.0000 | ORAL_TABLET | Freq: Every day | ORAL | Status: DC

## 2022-10-25 MED ORDER — ONDANSETRON HCL 4 MG/2ML IJ SOLN: 4.0000 mg | Freq: Four times a day (QID) | INTRAMUSCULAR | Status: DC | PRN

## 2022-10-25 MED ORDER — DIBUCAINE (PERIANAL) 1 % EX OINT: 1.0000 | TOPICAL_OINTMENT | CUTANEOUS | Status: DC | PRN

## 2022-10-25 MED ORDER — LACTATED RINGERS IV SOLN: INTRAVENOUS | Status: DC

## 2022-10-25 MED ORDER — SODIUM CHLORIDE 0.9% FLUSH
3.0000 mL | Freq: Two times a day (BID) | INTRAVENOUS | Status: DC
  Administered 2022-10-25: 3 mL via INTRAVENOUS

## 2022-10-25 MED ORDER — OXYTOCIN 10 UNIT/ML IJ SOLN
10.0000 [IU] | Freq: Once | INTRAMUSCULAR | Status: AC
  Administered 2022-10-25: 10 [IU] via INTRAMUSCULAR

## 2022-10-25 MED ORDER — IBUPROFEN 600 MG PO TABS: 600.0000 mg | ORAL_TABLET | Freq: Four times a day (QID) | ORAL | Status: DC

## 2022-10-25 MED ORDER — SENNOSIDES-DOCUSATE SODIUM 8.6-50 MG PO TABS
2.0000 | ORAL_TABLET | ORAL | Status: DC
  Filled 2022-10-25 (×2): qty 2

## 2022-10-25 MED ORDER — FENTANYL CITRATE (PF) 100 MCG/2ML IJ SOLN: 50.0000 ug | INTRAMUSCULAR | Status: DC | PRN

## 2022-10-25 MED ORDER — OXYTOCIN BOLUS FROM INFUSION
333.0000 mL | Freq: Once | INTRAVENOUS | Status: AC
  Administered 2022-10-25: 333 mL via INTRAVENOUS

## 2022-10-25 MED ORDER — IBUPROFEN 600 MG PO TABS
600.0000 mg | ORAL_TABLET | Freq: Four times a day (QID) | ORAL | Status: DC
  Filled 2022-10-25 (×3): qty 1

## 2022-10-25 MED ORDER — COCONUT OIL OIL: 1.0000 | TOPICAL_OIL | Status: DC | PRN

## 2022-10-25 MED ORDER — ONDANSETRON HCL 4 MG PO TABS: 4.0000 mg | ORAL_TABLET | ORAL | Status: DC | PRN

## 2022-10-25 MED ORDER — MEASLES, MUMPS & RUBELLA VAC IJ SOLR: 0.5000 mL | Freq: Once | INTRAMUSCULAR | Status: DC

## 2022-10-25 MED ORDER — BENZOCAINE-MENTHOL 20-0.5 % EX AERO: 1.0000 | INHALATION_SPRAY | CUTANEOUS | Status: DC | PRN

## 2022-10-25 MED ORDER — PRENATAL MULTIVITAMIN CH
1.0000 | ORAL_TABLET | Freq: Every day | ORAL | Status: DC
  Administered 2022-10-25: 1 via ORAL
  Filled 2022-10-25 (×2): qty 1

## 2022-10-25 MED ORDER — LIDOCAINE HCL (PF) 1 % IJ SOLN
30.0000 mL | INTRAMUSCULAR | Status: AC | PRN
  Administered 2022-10-25: 30 mL via SUBCUTANEOUS
  Filled 2022-10-25: qty 30

## 2022-10-25 MED ORDER — TRANEXAMIC ACID-NACL 1000-0.7 MG/100ML-% IV SOLN: 1000.0000 mg | INTRAVENOUS | Status: AC

## 2022-10-25 NOTE — H&P (Signed)
Jocelynne Darnita Buick is a 37 y.o. female, G2P1001 at 38.4 weeks, presenting for active labor.  Patient receives care at [facility] and was supervised for a low-risk pregnancy. Pregnancy and medical history significant for problems as listed below. She is GBS negative.  She is anticipating a female infant and requests NFP for PP birth control method.     Patient Active Problem List   Diagnosis Date Noted   Indication for care in labor and delivery, antepartum 10/25/2022   LGA (large for gestational age) fetus affecting management of mother, third trimester 09/29/2022   Supervision of normal pregnancy in third trimester 04/30/2022   Advanced maternal age in multigravida, third trimester 04/30/2022   Rubella non-immune status, antepartum 02/22/2018    History of present pregnancy:  Last evaluation:  August 29 in office by Dr. Marquis Lunch. Anyanwu.  BP: 121/79  Pulse: 82  Weight: 194 lb 3.2 oz (88.1 kg)          Nursing Staff Provider  Office Location [place] Dating  11/04/2022, by Last Menstrual Period  Language  English Anatomy US  WNL but limited, f/u 4 weeks  Flu Vaccine    Genetic/Carrier Screen  NIPS:   declined AFP:    Horizon:  TDaP Vaccine   declined Hgb A1C or  GTT Early  Third trimester Normal 2 hr GTT  COVID Vaccine     LAB RESULTS   Rhogam  A/Positive/-- (03/14 1518)  Blood Type A/Positive/-- (03/14 1518)   Baby Feeding Plan Both Antibody Negative (03/14 1518)  Contraception FAM/Rhythm Rubella <0.90 (03/14 1518)  Circumcision No RPR Non Reactive (03/14 1518)   Pediatrician  [place] HBsAg Negative (03/14 1518)   Support Person Husband HCVAb Non Reactive (03/14 1518)   Prenatal Classes   HIV Non Reactive (03/14 1518)     BTL Consent   GBS  Negative  VBAC Consent   Pap       Diagnosis  Date Value Ref Range Status  02/22/2018     Final    NEGATIVE FOR INTRAEPITHELIAL LESIONS OR MALIGNANCY.             DME Rx [ ]  BP cuff [ ]  Weight Scale Waterbirth  [ ]  Class [ ]  Consent [ ]  CNM visit   PHQ9 & GAD7 [  ] new OB [ x ] 28 weeks  [  ] 36 weeks Induction  [ ]  Orders Entered [ ] [place] Y/N     OB History     Gravida  2   Para  1   Term  1   Preterm      AB      Living  1      SAB      IAB      Ectopic      Multiple  0   Live Births  1             Past Medical History:  Diagnosis Date   Shingles 57   Past Surgical History:  Procedure Laterality Date   WISDOM TOOTH EXTRACTION  2009   Family History: family history includes Healthy in her father; Heart disease in her maternal grandfather; Hypertension in her mother; Thyroid disease in her maternal grandmother. Social History:  reports that she has never smoked. She has never used smokeless tobacco. She reports that she does not currently use alcohol. She reports that she does not use drugs.   Prenatal Transfer Tool  Maternal Diabetes: No Genetic Screening: Declined Maternal Ultrasounds/Referrals:  Normal Fetal Ultrasounds or other Referrals:  None Maternal Substance Abuse:  No Significant Maternal Medications:  None Significant Maternal Lab Results: Group B Strep negative   Maternal Assessment:  ROS: +Contractions, +LOF, -Vaginal Bleeding, +Fetal Movement  All other systems reviewed and negative.    Allergies  Allergen Reactions   Pine Needles [Pine Tar]    Tree Extract        Last menstrual period 01/28/2022, currently breastfeeding.  Physical Exam Vitals reviewed.  Constitutional:      General: She is in acute distress (With contractions).     Appearance: Normal appearance.  HENT:     Head: Normocephalic and atraumatic.  Eyes:     Conjunctiva/sclera: Conjunctivae normal.  Cardiovascular:     Rate and Rhythm: Normal rate.  Pulmonary:     Effort: Pulmonary effort is normal.  Genitourinary:    General: Normal vulva.  Musculoskeletal:        General: Normal range of motion.     Cervical back: Normal range of motion.  Skin:    General: Skin is warm and dry.   Neurological:     Mental Status: She is alert and oriented to person, place, and time.  Psychiatric:        Mood and Affect: Mood normal.        Behavior: Behavior normal.     Fetal Assessment: Leopolds: Deferred -Pelvis: Proven to 3970gr -Presentation: Vertex  FHR: 130  UCs:  Palpates moderate    Assessment IUP at 38.4 weeks Active Labor GBS Negative AMA   Plan: Admit to [employer]  Routine Labor and Delivery Orders per Protocol Anticipate SVD   Joellyn Quails, MSN 10/25/2022, 4:45 AM

## 2022-10-25 NOTE — Discharge Summary (Signed)
Postpartum Discharge Summary  Date of Service updated***     Patient Name: Katrina Tate DOB: [birth year] MRN: [id number]  Date of admission: 10/25/2022 Delivery date:10/25/2022 Delivering provider: Tieasha Larsen C Date of discharge: 10/25/2022  Admitting diagnosis: Indication for care in labor and delivery, antepartum [O75.9] Intrauterine pregnancy: [redacted]w[redacted]d     Secondary diagnosis:  Principal Problem:   Indication for care in labor and delivery, antepartum  Additional problems: ***    Discharge diagnosis: Term Pregnancy Delivered                                              Post partum procedures:{Postpartum procedures:23558} Augmentation: N/A Complications: None  Hospital course: Onset of Labor With Vaginal Delivery      37 y.o. yo G2P1001 at [redacted]w[redacted]d was admitted in Active Labor on 10/25/2022. Labor course was complicated by n/a   Membrane Rupture Time/Date:  ,   Delivery Method:Vaginal, Spontaneous Operative Delivery:N/A Episiotomy: None Lacerations:  2nd degree;Perineal Patient had a postpartum course complicated by ***.  She is ambulating, tolerating a regular diet, passing flatus, and urinating well. Patient is discharged home in stable condition on 10/25/22.  Newborn Data: Birth date:10/25/2022 Birth time:4:44 AM Gender:Female Living status:Living Apgars:9 ,9  Weight:   Magnesium Sulfate received: {Mag received:30440022} BMZ received: No Rhophylac:N/A MMR:{MMR:30440033} ordered *** T-DaP: declined prenatally  Flu: {HQI:69629} Transfusion:{Transfusion received:30440034}  Physical exam  Vitals:   10/25/22 0515  BP: 120/77  Pulse: 73  Resp: 18  Temp: 97.8 F (36.6 C)  TempSrc: Oral   General: {Exam; general:21111117} Lochia: {Desc; appropriate/inappropriate:30686::"appropriate"} Uterine Fundus: {Desc; firm/soft:30687} Incision: {Exam; incision:21111123} DVT Evaluation: {Exam; BMW:4132440} Labs: Lab Results  Component Value Date   WBC 8.2  08/14/2022   HGB 12.2 08/14/2022   HCT 36.5 08/14/2022   MCV 97 08/14/2022   PLT 206 08/14/2022       No data to display         Edinburgh Score:    09/12/2018    6:40 AM  Edinburgh Postnatal Depression Scale Screening Tool  I have been able to laugh and see the funny side of things. 0  I have looked forward with enjoyment to things. 0  I have blamed myself unnecessarily when things went wrong. 1  I have been anxious or worried for no good reason. 1  I have felt scared or panicky for no good reason. 2  Things have been getting on top of me. 1  I have been so unhappy that I have had difficulty sleeping. 0  I have felt sad or miserable. 0  I have been so unhappy that I have been crying. 0  The thought of harming myself has occurred to me. 0  Edinburgh Postnatal Depression Scale Total 5     After visit meds:  Allergies as of 10/25/2022       Reactions   Pine Needles [pine Tar]    Tree Extract      Med Rec must be completed prior to using this [facility]***        Discharge home in stable condition Infant Feeding: {Baby feeding:23562} Infant Disposition:{CHL IP OB HOME WITH NUUVOZ:36644} Discharge instruction: per After Visit Summary and Postpartum booklet. Activity: Advance as tolerated. Pelvic rest for 6 weeks.  Diet: {OB IHKV:42595638} Future Appointments: Future Appointments  Date Time Provider Department Center  10/30/2022  3:30 PM  Tereso Newcomer, MD [facility]   Follow up Visit: Sent message to [facility] 8/31  Please schedule this patient for a In person postpartum visit in 6 weeks with the following provider: Any provider. Additional Postpartum F/U: n/a   High risk pregnancy complicated by:  LGA, AMA Delivery mode:  Vaginal, Spontaneous Anticipated Birth Control:   Family planning, Natural    10/25/2022 Hessie Dibble, MD

## 2022-10-25 NOTE — MAU Note (Signed)
5366- This RN pulled patient straight back from lobby due to urge to push. Patient grossly ruptured in MAU lobby, patient reports contractions back to back. J Emly CNM called to bedside.   FHT:120  Patient SVE: complete  0431- Patient transferred to LD 202 in stretcher by this RN, A Twitty RN and Sabas Sous CNM. FHT: 130

## 2022-10-26 LAB — CBC
HCT: 34.6 % — ABNORMAL LOW (ref 36.0–46.0)
Hemoglobin: 12.2 g/dL (ref 12.0–15.0)
MCH: 32 pg (ref 26.0–34.0)
MCHC: 35.3 g/dL (ref 30.0–36.0)
MCV: 90.8 fL (ref 80.0–100.0)
Platelets: 179 10*3/uL (ref 150–400)
RBC: 3.81 MIL/uL — ABNORMAL LOW (ref 3.87–5.11)
RDW: 12.8 % (ref 11.5–15.5)
WBC: 9 10*3/uL (ref 4.0–10.5)
nRBC: 0 % (ref 0.0–0.2)

## 2022-10-26 MED ORDER — SENNOSIDES-DOCUSATE SODIUM 8.6-50 MG PO TABS: 2.0000 | ORAL_TABLET | Freq: Every evening | ORAL | 0 refills | Status: AC | PRN

## 2022-10-26 MED ORDER — ACETAMINOPHEN 325 MG PO TABS: 650.0000 mg | ORAL_TABLET | ORAL | 0 refills | Status: AC | PRN

## 2022-10-26 MED ORDER — IBUPROFEN 600 MG PO TABS: 600.0000 mg | ORAL_TABLET | Freq: Four times a day (QID) | ORAL | 0 refills | Status: AC

## 2022-10-26 NOTE — Lactation Note (Signed)
This note was copied from a baby's chart. Lactation Consultation Note  Patient Name: Katrina Tate [id number] Date: 10/26/2022 Age:38 hours Reason for consult: Initial assessment;Early term 37-38.6wks;Other (Comment) (LGA, AMA)  LC spoke with P2 Mom of ET infant.  Mom has been bottle feeding formula while in the hospital and plans to breastfeed baby once she is home.  Mom prefers to breastfeed only at home, as she did with her fist son.  Talked about the benefit of early milk removal and breastfeeding/pumping/hand expressing and Mom acknowledged this as best practice, but declined lactation assistance or putting baby to breast until she is discharged home.  Reviewed benefit of STS and hand expression for drops of colostrum and milk supply. Engorgement prevention and treatment reviewed.  Encouraged Mom to ask for help prn.  Maternal Data Has patient been taught Hand Expression?: Yes Does the patient have breastfeeding experience prior to this delivery?: Yes How long did the patient breastfeed?: 9 months of combination breast and formula  Feeding Mother's Current Feeding Choice: Breast Milk and Formula   Interventions Interventions: Breast feeding basics reviewed;Skin to skin;Breast massage;Hand express;Education;LC Services brochure  Discharge Discharge Education: Engorgement and breast care Pump: Personal;DEBP (lansinoh DEBP)  Consult Status Consult Status: PRN Date: 10/27/22 Follow-up type: In-patient    Katrina Tate 10/26/2022, 9:05 AM

## 2022-10-26 NOTE — Plan of Care (Signed)
  Problem: Education: Goal: Knowledge of Childbirth will improve Outcome: Progressing Goal: Ability to make informed decisions regarding treatment and plan of care will improve Outcome: Progressing Goal: Ability to state and carry out methods to decrease the pain will improve Outcome: Progressing Goal: Individualized Educational Video(s) Outcome: Progressing   Problem: Coping: Goal: Ability to verbalize concerns and feelings about labor and delivery will improve Outcome: Progressing   Problem: Life Cycle: Goal: Ability to make normal progression through stages of labor will improve Outcome: Progressing   

## 2022-10-26 NOTE — Progress Notes (Signed)
Attending Circumcision Counseling Progress Note   Patient desires circumcision for her female infant.  Circumcision procedure details discussed, risks and benefits of procedure were also discussed. Risks include bleeding , infection, injury of glans which may lead to penile deformity or urinary tract issues, unsatisfactory cosmetic appearance and other potential complications related to the procedure.  It was emphasized that this is an elective procedure.  Patient wants to proceed with circumcision; written informed consent obtained.    Harvie Bridge, MD Obstetrician & Gynecologist, [facility] for [employer], [facility]

## 2022-10-30 ENCOUNTER — Encounter: Payer: BLUE CROSS/BLUE SHIELD | Admitting: Obstetrics & Gynecology

## 2022-11-14 ENCOUNTER — Encounter: Payer: Self-pay | Admitting: *Deleted

## 2022-11-26 ENCOUNTER — Telehealth (HOSPITAL_COMMUNITY): Payer: Self-pay | Admitting: *Deleted

## 2022-11-26 NOTE — Telephone Encounter (Signed)
Attempted hospital discharge follow-up call. Left message for patient to return RN call with any questions or concerns. Deforest Hoyles, RN, 11/26/22, [phone]

## 2022-12-08 ENCOUNTER — Encounter: Payer: Self-pay | Admitting: Obstetrics & Gynecology

## 2022-12-08 ENCOUNTER — Ambulatory Visit: Payer: BLUE CROSS/BLUE SHIELD | Admitting: Obstetrics & Gynecology

## 2022-12-08 NOTE — Progress Notes (Signed)
Post Partum Visit Note  Katrina Tate is a 37 y.o. [id number] female who presents for a postpartum visit. She is 6 weeks postpartum following a normal spontaneous vaginal delivery.  I have fully reviewed the prenatal and intrapartum course. The delivery was at 38 gestational weeks.  Anesthesia: none. Postpartum course has been uncomplicated. Baby is doing well. Baby is feeding by both breast and bottle - [id number] . Bleeding staining only. Bowel function is abnormal: hard stools taking stool softeners . Bladder function is normal. Patient is not sexually active. Desired contraception method is fertility awareness methods. Postpartum depression screening: negative.  The pregnancy intention screening data noted above was reviewed. Potential methods of contraception were discussed. The patient elected to proceed with No data recorded.   Edinburgh Postnatal Depression Scale - 12/08/22 1526       Edinburgh Postnatal Depression Scale:  In the Past 7 Days   I have been able to laugh and see the funny side of things. 0    I have looked forward with enjoyment to things. 0    I have blamed myself unnecessarily when things went wrong. 1    I have been anxious or worried for no good reason. 1    I have felt scared or panicky for no good reason. 0    Things have been getting on top of me. 1    I have been so unhappy that I have had difficulty sleeping. 1    I have felt sad or miserable. 0    I have been so unhappy that I have been crying. 0    The thought of harming myself has occurred to me. 0    Edinburgh Postnatal Depression Scale Total 4             Health Maintenance Due  Topic Date Due   DTaP/Tdap/Td (1 - Tdap) Never done   Cervical Cancer Screening (HPV/Pap Cotest)  02/22/2021   INFLUENZA VACCINE  Never done   COVID-19 Vaccine (1 - 2023-24 season) Never done    The following portions of the patient's history were reviewed and updated as appropriate: allergies, current  medications, past family history, past medical history, past social history, past surgical history, and problem list.  Review of Systems Pertinent items noted in HPI and remainder of comprehensive ROS otherwise negative.  Objective: (done with RN present as chaperone)  BP 94/67   Pulse 90   Wt 177 lb (80.3 kg)   LMP 01/28/2022 (Approximate)   Breastfeeding Yes   BMI 26.91 kg/m    General:  alert and no distress   Breasts:  normal  Lungs: clear to auscultation bilaterally  Heart:  regular rate and rhythm  Abdomen: soft, non-tender; bowel sounds normal; no masses,  no organomegaly   GU exam:  normal, well healed laceration.Brown discharge noted.       Assessment:   Normal postpartum exam.   Plan:   Essential components of care per ACOG recommendations:  1.  Mood and well being: Patient with negative depression screening today. Reviewed local resources for support.  - Patient tobacco use? No.   - hx of drug use? No.    2. Infant care and feeding:  -Patient currently breastmilk feeding? Yes. Reviewed importance of draining breast regularly to support lactation.  -Social determinants of health (SDOH) reviewed in EPIC. No concerns.  3. Sexuality, contraception and birth spacing - Patient does not want a pregnancy in the next year.  Desired family size  is 2 children.  - Reviewed reproductive life planning. Reviewed contraceptive methods based on pt preferences and effectiveness.  Patient desired FAM or LAM today, husband is considering vasectomy   - Discussed birth spacing of 18 months  4. Sleep and fatigue -Encouraged family/partner/community support of 4 hrs of uninterrupted sleep to help with mood and fatigue  5. Physical Recovery  - Discussed patients delivery and complications. She describes her labor as good. - Patient had a Vaginal, no problems at delivery. Patient had a 2nd degree laceration. Perineal healing reviewed. Patient expressed understanding - Patient has  urinary incontinence? No. - Patient is safe to resume physical and sexual activity  6.  Health Maintenance - HM due items addressed Yes - Last pap smear  Diagnosis  Date Value Ref Range Status  02/22/2018   Final   NEGATIVE FOR INTRAEPITHELIAL LESIONS OR MALIGNANCY.   Pap smear not done at today's visit as per patient's request (she declined this!!).  -Breast Cancer screening indicated? No.     Jaynie Collins, MD [facility], [facility]

## 2023-01-10 ENCOUNTER — Other Ambulatory Visit: Payer: Self-pay

## 2023-01-10 ENCOUNTER — Emergency Department: Payer: BLUE CROSS/BLUE SHIELD

## 2023-01-10 ENCOUNTER — Emergency Department
Admission: EM | Admit: 2023-01-10 | Discharge: 2023-01-10 | Disposition: A | Discharge status: Home / Self Care | Payer: BLUE CROSS/BLUE SHIELD | Attending: Student in an Organized Health Care Education/Training Program | Admitting: Student in an Organized Health Care Education/Training Program

## 2023-01-10 DIAGNOSIS — R200 Anesthesia of skin: Secondary | ICD-10-CM | POA: Insufficient documentation

## 2023-01-10 DIAGNOSIS — R42 Dizziness and giddiness: Secondary | ICD-10-CM | POA: Diagnosis not present

## 2023-01-10 LAB — URINALYSIS, ROUTINE W REFLEX MICROSCOPIC
Bilirubin Urine: NEGATIVE
Glucose, UA: NEGATIVE mg/dL
Hgb urine dipstick: NEGATIVE
Ketones, ur: NEGATIVE mg/dL
Leukocytes,Ua: NEGATIVE
Nitrite: NEGATIVE
Protein, ur: NEGATIVE mg/dL
Specific Gravity, Urine: 1.005 (ref 1.005–1.030)
pH: 6 (ref 5.0–8.0)

## 2023-01-10 LAB — CBC
HCT: 44.1 % (ref 36.0–46.0)
Hemoglobin: 14.9 g/dL (ref 12.0–15.0)
MCH: 31 pg (ref 26.0–34.0)
MCHC: 33.8 g/dL (ref 30.0–36.0)
MCV: 91.7 fL (ref 80.0–100.0)
Platelets: 269 10*3/uL (ref 150–400)
RBC: 4.81 MIL/uL (ref 3.87–5.11)
RDW: 13.1 % (ref 11.5–15.5)
WBC: 6.4 10*3/uL (ref 4.0–10.5)
nRBC: 0 % (ref 0.0–0.2)

## 2023-01-10 LAB — DIFFERENTIAL
Abs Immature Granulocytes: 0.02 10*3/uL (ref 0.00–0.07)
Basophils Absolute: 0 10*3/uL (ref 0.0–0.1)
Basophils Relative: 1 %
Eosinophils Absolute: 0 10*3/uL (ref 0.0–0.5)
Eosinophils Relative: 0 %
Immature Granulocytes: 0 %
Lymphocytes Relative: 18 %
Lymphs Abs: 1.2 10*3/uL (ref 0.7–4.0)
Monocytes Absolute: 0.2 10*3/uL (ref 0.1–1.0)
Monocytes Relative: 4 %
Neutro Abs: 4.9 10*3/uL (ref 1.7–7.7)
Neutrophils Relative %: 77 %

## 2023-01-10 LAB — APTT: aPTT: 30 s (ref 24–36)

## 2023-01-10 LAB — COMPREHENSIVE METABOLIC PANEL
ALT: 15 U/L (ref 0–44)
AST: 20 U/L (ref 15–41)
Albumin: 4.6 g/dL (ref 3.5–5.0)
Alkaline Phosphatase: 74 U/L (ref 38–126)
Anion gap: 8 (ref 5–15)
BUN: 16 mg/dL (ref 6–20)
CO2: 26 mmol/L (ref 22–32)
Calcium: 9.3 mg/dL (ref 8.9–10.3)
Chloride: 103 mmol/L (ref 98–111)
Creatinine, Ser: 0.91 mg/dL (ref 0.44–1.00)
GFR, Estimated: >60 mL/min (ref >60)
Glucose, Bld: 92 mg/dL (ref 70–99)
Potassium: 3.7 mmol/L (ref 3.5–5.1)
Sodium: 137 mmol/L (ref 135–145)
Total Bilirubin: 0.9 mg/dL (ref <1.2)
Total Protein: 8.3 g/dL — ABNORMAL HIGH (ref 6.5–8.1)

## 2023-01-10 LAB — PROTIME-INR
INR: 1.1 (ref 0.8–1.2)
Prothrombin Time: 13.9 s (ref 11.4–15.2)

## 2023-01-10 LAB — CBG MONITORING, ED: Glucose-Capillary: 93 mg/dL (ref 70–99)

## 2023-01-10 LAB — ETHANOL: Alcohol, Ethyl (B): <10 mg/dL (ref <10)

## 2023-01-10 MED ORDER — SODIUM CHLORIDE 0.9% FLUSH: 3.0000 mL | Freq: Once | INTRAVENOUS | Status: DC

## 2023-01-10 NOTE — ED Notes (Signed)
Urine sent off at this time

## 2023-01-10 NOTE — ED Notes (Signed)
First nurse note: pt reports left sided numbness started at 0900 today, reports no longer feeling numbness sensation.

## 2023-01-10 NOTE — ED Provider Notes (Signed)
[facility] Provider Note    Event Date/Time   First MD Initiated Contact with Patient 01/10/23 1159     (approximate)   History   Numbness   HPI  Katrina Tate is a 37 y.o. female presents to the ER for evaluation of episode of blurry vision left-sided hand numbness that she woke up with.  She is currently breast-feeding has a 60-month-old at home went down the stairs to feed babies felt like she was getting lightheaded and sat down with improvement in symptoms.  Symptoms resolved after about 10 to 15 minutes.  Has never had symptoms like this before.  Denies any pain.  She is now back to normal.  Denies any chest pain no shortness of breath.     Physical Exam   Triage Vital Signs: ED Triage Vitals [01/10/23 1155]  Encounter Vitals Group     BP 123/82     Systolic BP Percentile      Diastolic BP Percentile      Pulse Rate 95     Resp 18     Temp 98 F (36.7 C)     Temp Source Oral     SpO2 99 %     Weight      Height      Head Circumference      Peak Flow      Pain Score      Pain Loc      Pain Education      Exclude from Growth Chart     Most recent vital signs: Vitals:   01/10/23 1155  BP: 123/82  Pulse: 95  Resp: 18  Temp: 98 F (36.7 C)  SpO2: 99%     Constitutional: Alert  Eyes: Conjunctivae are normal.  Head: Atraumatic. Nose: No congestion/rhinnorhea. Mouth/Throat: Mucous membranes are moist.   Neck: Painless ROM.  Cardiovascular:   Good peripheral circulation. Respiratory: Normal respiratory effort.  No retractions.  Gastrointestinal: Soft and nontender.  Musculoskeletal:  no deformity Neurologic:  CN- intact.  No facial droop, Normal FNF.  Normal heel to shin.  Sensation intact bilaterally. Normal speech and language. No gross focal neurologic deficits are appreciated. No gait instability. Skin:  Skin is warm, dry and intact. No rash noted. Psychiatric: Mood and affect are normal. Speech and behavior are  normal.    ED Results / Procedures / Treatments   Labs (all labs ordered are listed, but only abnormal results are displayed) Labs Reviewed  COMPREHENSIVE METABOLIC PANEL - Abnormal; Notable for the following components:      Result Value   Total Protein 8.3 (*)    All other components within normal limits  URINALYSIS, ROUTINE W REFLEX MICROSCOPIC - Abnormal; Notable for the following components:   Color, Urine STRAW (*)    APPearance CLEAR (*)    All other components within normal limits  PROTIME-INR  APTT  CBC  DIFFERENTIAL  ETHANOL  CBG MONITORING, ED  CBG MONITORING, ED     EKG  ED ECG REPORT I, Willy Eddy, the attending physician, personally viewed and interpreted this ECG.   Date: 01/10/2023  EKG Time: 11:54  Rate: 90  Rhythm: sinus  Axis: normal  Intervals: normal   ST&T Change: no stemi, no depression    RADIOLOGY Please see ED Course for my review and interpretation.  I personally reviewed all radiographic images ordered to evaluate for the above acute complaints and reviewed radiology reports and findings.  These findings were personally  discussed with the patient.  Please see medical record for radiology report.    PROCEDURES:  Critical Care performed:   Procedures   MEDICATIONS ORDERED IN ED: Medications  sodium chloride flush (NS) 0.9 % injection 3 mL (3 mLs Intravenous Not Given 01/10/23 1206)     IMPRESSION / MDM / ASSESSMENT AND PLAN / ED COURSE  I reviewed the triage vital signs and the nursing notes.                              Differential diagnosis includes, but is not limited to, cva, tia, hypoglycemia, dehydration, electrolyte abnormality, dissection, sepsis  Patient presenting to the ER for evaluation of symptoms as described above.  Based on symptoms, risk factors and considered above differential, this presenting complaint could reflect a potentially life-threatening illness therefore the patient will be placed on  continuous pulse oximetry and telemetry for monitoring.  Laboratory evaluation will be sent to evaluate for the above complaints.  Patient clinically very well-appearing in no acute distress.  Neuroexam is nonfocal.  CT imaging on my review and interpretation without evidence of subdural or mass.  No acute intracranial abnormality per radiology.  Blood work is reassuring.  Given reassuring workup do not feel that further diagnostic testing clinically indicated.  Have low suspicion for TIA.  Not consistent with dissection sepsis or cardiac etiology.  Not consistent with eclampsia.  Vital signs are normal.  Patient stable and appropriate for outpatient follow-up       FINAL CLINICAL IMPRESSION(S) / ED DIAGNOSES   Final diagnoses:  Left sided numbness     Rx / DC Orders   ED Discharge Orders          Ordered    Ambulatory Referral to Primary Care (Establish Care)        01/10/23 1323             Note:  This document was prepared using Dragon voice recognition software and may include unintentional dictation errors.    Willy Eddy, MD 01/10/23 1323

## 2023-01-10 NOTE — ED Triage Notes (Signed)
Pt to ED via POV from home. Pt reports waking up at 08:50am and woke up with left hand numbness. Pt then reports blurry vision. Pt states got an alert from her watch of bradycardia. Pt reports then started getting numb in her lips. Pt's symptoms have subsided. Pt is 3months PP. Pt denies CP or SOB.

## 2023-02-21 NOTE — Progress Notes (Deleted)
 New patient visit  Patient: Katrina Tate   DOB: 1985-07-31   37 y.o. Female  MRN: [id number] Visit Date: 03/03/2023  Today's healthcare provider: Debera Lat, PA-C   No chief complaint on file.  Subjective    Katrina Tate is a 36 y.o. female who presents today as a new patient to establish care.  HPI  *** Discussed the use of AI scribe software for clinical note transcription with the patient, who gave verbal consent to proceed.  History of Present Illness            Past Medical History:  Diagnosis Date   Shingles 1993   Past Surgical History:  Procedure Laterality Date   WISDOM TOOTH EXTRACTION  2009   Family Status  Relation Name Status   Mother  Alive   Father  Alive   MGM  (Not Specified)   MGF  (Not Specified)   Neg Hx  (Not Specified)  No partnership data on file   Family History  Problem Relation Age of Onset   Hypertension Mother    Healthy Father    Thyroid disease Maternal Grandmother    Heart disease Maternal Grandfather    Breast cancer Neg Hx    Ovarian cancer Neg Hx    Colon cancer Neg Hx    Social History   Socioeconomic History   Marital status: Married    Spouse name: Not on file   Number of children: Not on file   Years of education: Not on file   Highest education level: Not on file  Occupational History   Occupation: part time, delivers groceries  Tobacco Use   Smoking status: Never   Smokeless tobacco: Never  Vaping Use   Vaping status: Never Used  Substance and Sexual Activity   Alcohol use: Not Currently   Drug use: Never   Sexual activity: Yes    Birth control/protection: None  Other Topics Concern   Not on file  Social History Narrative   Not on file   Social Drivers of Health   Financial Resource Strain: Not on file  Food Insecurity: No Food Insecurity (10/25/2022)   Hunger Vital Sign    Worried About Running Out of Food in the Last Year: Never true    Ran Out of Food in the Last Year: Never  true  Transportation Needs: No Transportation Needs (10/25/2022)   PRAPARE - Administrator, Civil Service (Medical): No    Lack of Transportation (Non-Medical): No  Physical Activity: Not on file  Stress: Not on file  Social Connections: Not on file   Outpatient Medications Prior to Visit  Medication Sig   acetaminophen (TYLENOL) 325 MG tablet Take 2 tablets (650 mg total) by mouth every 4 (four) hours as needed for moderate pain. (Patient not taking: Reported on 12/08/2022)   ibuprofen (ADVIL) 600 MG tablet Take 1 tablet (600 mg total) by mouth every 6 (six) hours. (Patient not taking: Reported on 12/08/2022)   Prenatal Vit-Fe Fumarate-FA (MULTIVITAMIN-PRENATAL) 27-0.8 MG TABS tablet Take 1 tablet by mouth daily at 12 noon.   senna-docusate (SENOKOT-S) 8.6-50 MG tablet Take 2 tablets by mouth at bedtime as needed for mild constipation.   No facility-administered medications prior to visit.   Allergies  Allergen Reactions   Pine Needles [Pine Tar]    Tree Extract     There is no immunization history for the selected administration types on file for this patient.  Health Maintenance  Topic Date Due  DTaP/Tdap/Td (1 - Tdap) Never done   INFLUENZA VACCINE  Never done   COVID-19 Vaccine (1 - 2024-25 season) Never done   Cervical Cancer Screening (HPV/Pap Cotest)  02/23/2023   Hepatitis C Screening  Completed   HIV Screening  Completed   HPV VACCINES  Aged Out    Patient Care Team: Patient, No Pcp Per as PCP - General (General Practice)  Review of Systems  All other systems reviewed and are negative.  Except see HPI   {Insert previous labs (optional):23779} {See past labs  Heme  Chem  Endocrine  Serology  Results Review (optional):1}   Objective    There were no vitals taken for this visit. {Insert last BP/Wt (optional):23777}{See vitals history (optional):1}   Physical Exam Vitals reviewed.  Constitutional:      General: She is not in acute  distress.    Appearance: Normal appearance. She is well-developed. She is not diaphoretic.  HENT:     Head: Normocephalic and atraumatic.  Eyes:     General: No scleral icterus.    Conjunctiva/sclera: Conjunctivae normal.  Neck:     Thyroid: No thyromegaly.  Cardiovascular:     Rate and Rhythm: Normal rate and regular rhythm.     Pulses: Normal pulses.     Heart sounds: Normal heart sounds. No murmur heard. Pulmonary:     Effort: Pulmonary effort is normal. No respiratory distress.     Breath sounds: Normal breath sounds. No wheezing, rhonchi or rales.  Musculoskeletal:     Cervical back: Neck supple.     Right lower leg: No edema.     Left lower leg: No edema.  Lymphadenopathy:     Cervical: No cervical adenopathy.  Skin:    General: Skin is warm and dry.     Findings: No rash.  Neurological:     Mental Status: She is alert and oriented to person, place, and time. Mental status is at baseline.  Psychiatric:        Mood and Affect: Mood normal.        Behavior: Behavior normal.     Depression Screen    08/14/2022    9:46 AM 05/08/2022    4:08 PM  PHQ 2/9 Scores  PHQ - 2 Score 0 0  PHQ- 9 Score 0 1   No results found for any visits on 03/03/23.  Assessment & Plan     *** Assessment and Plan              Encounter to establish care Welcomed to our clinic Reviewed past medical hx, social hx, family hx and surgical hx Pt advised to send all vaccination records or screening   No follow-ups on file.    The patient was advised to call back or seek an in-person evaluation if the symptoms worsen or if the condition fails to improve as anticipated.  I discussed the assessment and treatment plan with the patient. The patient was provided an opportunity to ask questions and all were answered. The patient agreed with the plan and demonstrated an understanding of the instructions.  I, Debera Lat, PA-C have reviewed all documentation for this visit. The  documentation on  03/03/2023   for the exam, diagnosis, procedures, and orders are all accurate and complete.  Debera Lat, [facility], MMS [facility] [phone] (phone) [phone] (fax)  [facility]

## 2023-03-03 ENCOUNTER — Ambulatory Visit: Payer: BC Managed Care – PPO | Admitting: Physician Assistant

## 2023-03-03 DIAGNOSIS — Z7689 Persons encountering health services in other specified circumstances: Secondary | ICD-10-CM

## 2023-04-03 ENCOUNTER — Encounter: Payer: Self-pay | Admitting: Obstetrics & Gynecology

## 2023-11-19 ENCOUNTER — Ambulatory Visit (INDEPENDENT_AMBULATORY_CARE_PROVIDER_SITE_OTHER): Payer: Self-pay | Admitting: Podiatry

## 2023-11-19 ENCOUNTER — Encounter: Payer: Self-pay | Admitting: Podiatry

## 2023-11-19 DIAGNOSIS — B351 Tinea unguium: Secondary | ICD-10-CM

## 2023-11-19 NOTE — Addendum Note (Signed)
 Addended by: WAYLAN ELIDIA PARAS on: 11/19/2023 11:33 AM   Modules accepted: Orders

## 2023-11-19 NOTE — Progress Notes (Signed)
  Subjective:  Patient ID: Katrina Tate, female    DOB: 1985-03-07,   MRN: [id number]  Chief Complaint  Patient presents with   Nail Problem    I have yellow and thick toenails.  I've tried over the counter fungal medicine but it hasn't helped.  They're starting to get hard to cut.    38 y.o. female presents for concern of thickened and yellow toenail on multiple toenails. This has been present for over a year. Relates she has tried over the counter fungal medication without much improvement.  . Denies any other pedal complaints. Denies n/v/f/c.   Past Medical History:  Diagnosis Date   Shingles 1993    Objective:  Physical Exam: Vascular: DP/PT pulses 2/4 bilateral. CFT <3 seconds. Normal hair growth on digits. No edema.  Skin. No lacerations or abrasions bilateral feet. Hallux and second digits toenails thickened discolored and with subungual debris.  Musculoskeletal: MMT 5/5 bilateral lower extremities in DF, PF, Inversion and Eversion. Deceased ROM in DF of ankle joint.  Neurological: Sensation intact to light touch.   Assessment:   1. Onychomycosis      Plan:  Patient was evaluated and treated and all questions answered. -Examined patient -Discussed treatment options for painful dystrophic nails  -Clinical picture and Fungal culture was obtained by removing a portion of the hard nail itself from each of the involved toenails using a sterile nail nipper and sent to [facility] lab. Patient tolerated the biopsy procedure well without discomfort or need for anesthesia.  -Discussed fungal nail treatment options including oral, topical, and laser treatments.  -Patient to return in 4 weeks for follow up evaluation and discussion of fungal culture results or sooner if symptoms worsen.   Katrina Tate, DPM

## 2023-12-31 ENCOUNTER — Encounter: Payer: Self-pay | Admitting: Podiatry

## 2023-12-31 ENCOUNTER — Ambulatory Visit (INDEPENDENT_AMBULATORY_CARE_PROVIDER_SITE_OTHER): Admitting: Podiatry

## 2023-12-31 DIAGNOSIS — B351 Tinea unguium: Secondary | ICD-10-CM

## 2023-12-31 NOTE — Progress Notes (Signed)
  Subjective:  Patient ID: Katrina Tate, female    DOB: 01-28-1986,   MRN: [id number]  No chief complaint on file.   38 y.o. female presents for follow-up of fungal nails and to review culture results.. Denies any other pedal complaints. Denies n/v/f/c.   Past Medical History:  Diagnosis Date   Shingles 1993    Objective:  Physical Exam: Vascular: DP/PT pulses 2/4 bilateral. CFT <3 seconds. Normal hair growth on digits. No edema.  Skin. No lacerations or abrasions bilateral feet. Hallux and second digits toenails thickened discolored and with subungual debris.  Musculoskeletal: MMT 5/5 bilateral lower extremities in DF, PF, Inversion and Eversion. Deceased ROM in DF of ankle joint.  Neurological: Sensation intact to light touch.   Assessment:   1. Onychomycosis      Plan:  Patient was evaluated and treated and all questions answered. -Examined patient -Discussed treatment options for painful dystrophic nails  - Culture negative for fungus.  -Discussed treatment options including shoe gear modification minimally it is and potential nail. -Patient to return as needed   Asberry Failing, DPM

## 2023-12-31 NOTE — Patient Instructions (Signed)
